# Patient Record
Sex: Female | Born: 1980 | Hispanic: No | Marital: Married | Smoking: Never smoker
Health system: Southern US, Community
[De-identification: ages and names within clinical notes are randomized; demographics above are authoritative.]

## PROBLEM LIST (undated history)

## (undated) ENCOUNTER — Inpatient Hospital Stay (HOSPITAL_COMMUNITY): Payer: Self-pay

## (undated) DIAGNOSIS — O24419 Gestational diabetes mellitus in pregnancy, unspecified control: Secondary | ICD-10-CM

## (undated) DIAGNOSIS — J45909 Unspecified asthma, uncomplicated: Secondary | ICD-10-CM

## (undated) DIAGNOSIS — J302 Other seasonal allergic rhinitis: Secondary | ICD-10-CM

## (undated) HISTORY — PX: NO PAST SURGERIES: SHX2092

---

## 2012-09-03 ENCOUNTER — Emergency Department (HOSPITAL_COMMUNITY)
Admission: EM | Admit: 2012-09-03 | Discharge: 2012-09-03 | Disposition: A | Discharge status: Home / Self Care | Payer: No Typology Code available for payment source | Source: Home / Self Care

## 2012-09-03 ENCOUNTER — Encounter (HOSPITAL_COMMUNITY): Payer: Self-pay

## 2012-09-03 DIAGNOSIS — J309 Allergic rhinitis, unspecified: Secondary | ICD-10-CM

## 2012-09-03 DIAGNOSIS — K089 Disorder of teeth and supporting structures, unspecified: Secondary | ICD-10-CM

## 2012-09-03 DIAGNOSIS — K0889 Other specified disorders of teeth and supporting structures: Secondary | ICD-10-CM

## 2012-09-03 MED ORDER — AMOXICILLIN 500 MG PO CAPS: 1000.0000 mg | ORAL_CAPSULE | Freq: Two times a day (BID) | ORAL | Status: DC

## 2012-09-03 MED ORDER — HYDROCODONE-ACETAMINOPHEN 5-325 MG PO TABS: 1.0000 | ORAL_TABLET | ORAL | Status: DC | PRN

## 2012-09-03 MED ORDER — MONTELUKAST SODIUM 10 MG PO TABS: 10.0000 mg | ORAL_TABLET | Freq: Every day | ORAL | Status: DC

## 2012-09-03 MED ORDER — KETOROLAC TROMETHAMINE 60 MG/2ML IM SOLN
60.0000 mg | Freq: Once | INTRAMUSCULAR | Status: AC
  Administered 2012-09-03: 60 mg via INTRAMUSCULAR

## 2012-09-03 NOTE — ED Notes (Signed)
C/o pain in right facial area; ? Bad tooth??; NAD

## 2012-09-03 NOTE — ED Provider Notes (Signed)
History     CSN: [id number]  Arrival date & time 09/03/12  1222   First MD Initiated Contact with Patient 09/03/12 1330      Chief Complaint  Patient presents with  . Facial Pain    (Consider location/radiation/quality/duration/timing/severity/associated sxs/prior treatment) HPI Comments: 32 year old female presents with a toothache in allergies. She speaks some [place], her husband speaks fluent [place]. His been complaining of right facial and toothache pain suggestive day. Pain is located adjacent to the right upper third molar.   She is also complaining of watery, sneezing and itchy nose for one year. Has taken some over-the-counter medications.   History reviewed. No pertinent past medical history.  History reviewed. No pertinent past surgical history.  History reviewed. No pertinent family history.  History  Substance Use Topics  . Smoking status: Not on file  . Smokeless tobacco: Not on file  . Alcohol Use: Not on file    OB History   Grav Para Term Preterm Abortions TAB SAB Ect Mult Living                  Review of Systems  Constitutional: Negative for fever, chills and activity change.  HENT: Positive for dental problem. Negative for ear pain, nosebleeds, congestion, sore throat and mouth sores.   Respiratory: Negative.   Cardiovascular: Negative.   Gastrointestinal: Negative.   Skin: Negative for color change, pallor and rash.  Neurological: Negative.   Psychiatric/Behavioral: Negative.     Allergies  Review of patient's allergies indicates no known allergies.  Home Medications   Current Outpatient Rx  Name  Route  Sig  Dispense  Refill  . amoxicillin (AMOXIL) 500 MG capsule   Oral   Take 2 capsules (1,000 mg total) by mouth 2 (two) times daily.   28 capsule   0   . HYDROcodone-acetaminophen (NORCO/VICODIN) 5-325 MG per tablet   Oral   Take 1 tablet by mouth every 4 (four) hours as needed for pain.   15 tablet   0   . montelukast  (SINGULAIR) 10 MG tablet   Oral   Take 1 tablet (10 mg total) by mouth at bedtime. For allergies   30 tablet   0     BP 106/71  Pulse 70  Temp(Src) 97.3 F (36.3 C) (Oral)  Resp 18  SpO2 100%  Physical Exam  Nursing note and vitals reviewed. Constitutional: She is oriented to person, place, and time. She appears well-developed and well-nourished.  HENT:  Right Ear: External ear normal.  Left Ear: External ear normal.  Mouth/Throat: Oropharynx is clear and moist. No oropharyngeal exudate.  Dental tenderness in the right upper third molar. There is puffiness in the mucosal aspect of the buccal cavity adjacent to the involved tooth. No purulence is seen. Is also mild gingival erythema. No external puffiness or swelling is appreciated.  Pulmonary/Chest: Effort normal.  Musculoskeletal: She exhibits no edema.  Neurological: She is alert and oriented to person, place, and time. She exhibits normal muscle tone.  Skin: Skin is warm and dry.  Psychiatric: She has a normal mood and affect.    ED Course  Procedures (including critical care time)  Labs Reviewed - No data to display No results found.   1. Odontalgia   2. Allergic rhinitis       MDM  Norco 5 q 4h prn pain Amoxil 500 2 bid for 7 d Ice to side of face singulair 10mg  q hs for allergies.  Call the dentist  on your [employer] for appoint.         Hayden Rasmussen, NP 09/03/12 [phone]

## 2012-09-03 NOTE — ED Provider Notes (Signed)
Medical screening examination/treatment/procedure(s) were performed by resident physician or non-physician practitioner and as supervising physician I was immediately available for consultation/collaboration.   Rosana Farnell DOUGLAS MD.   Tyrez Berrios D Jenney Brester, MD 09/03/12 1638 

## 2012-11-21 ENCOUNTER — Emergency Department (HOSPITAL_COMMUNITY)
Admission: EM | Admit: 2012-11-21 | Discharge: 2012-11-21 | Disposition: A | Discharge status: Home / Self Care | Payer: Medicaid Other | Attending: Emergency Medicine | Admitting: Emergency Medicine

## 2012-11-21 ENCOUNTER — Ambulatory Visit: Payer: MEDICAID | Attending: Family Medicine | Admitting: Family Medicine

## 2012-11-21 ENCOUNTER — Encounter (HOSPITAL_COMMUNITY): Payer: Self-pay | Admitting: Adult Health

## 2012-11-21 ENCOUNTER — Emergency Department (HOSPITAL_COMMUNITY): Payer: Medicaid Other

## 2012-11-21 VITALS — BP 89/64 | HR 119 | Temp 99.8°F | Resp 19 | Wt 183.0 lb

## 2012-11-21 DIAGNOSIS — R5383 Other fatigue: Secondary | ICD-10-CM | POA: Insufficient documentation

## 2012-11-21 DIAGNOSIS — R51 Headache: Secondary | ICD-10-CM | POA: Insufficient documentation

## 2012-11-21 DIAGNOSIS — R5381 Other malaise: Secondary | ICD-10-CM | POA: Insufficient documentation

## 2012-11-21 DIAGNOSIS — K089 Disorder of teeth and supporting structures, unspecified: Secondary | ICD-10-CM

## 2012-11-21 DIAGNOSIS — J029 Acute pharyngitis, unspecified: Secondary | ICD-10-CM | POA: Insufficient documentation

## 2012-11-21 DIAGNOSIS — Z3202 Encounter for pregnancy test, result negative: Secondary | ICD-10-CM | POA: Insufficient documentation

## 2012-11-21 DIAGNOSIS — R52 Pain, unspecified: Secondary | ICD-10-CM | POA: Insufficient documentation

## 2012-11-21 DIAGNOSIS — K0889 Other specified disorders of teeth and supporting structures: Secondary | ICD-10-CM

## 2012-11-21 DIAGNOSIS — J039 Acute tonsillitis, unspecified: Secondary | ICD-10-CM

## 2012-11-21 LAB — BASIC METABOLIC PANEL
BUN: 10 mg/dL (ref 6–23)
CO2: 23 mEq/L (ref 19–32)
Glucose, Bld: 165 mg/dL — ABNORMAL HIGH (ref 70–99)
Potassium: 3.6 mEq/L (ref 3.5–5.1)
Sodium: 135 mEq/L (ref 135–145)

## 2012-11-21 LAB — CBC WITH DIFFERENTIAL/PLATELET
Eosinophils Relative: 0 % (ref 0–5)
Hemoglobin: 14.2 g/dL (ref 12.0–15.0)
Lymphocytes Relative: 9 % — ABNORMAL LOW (ref 12–46)
Lymphs Abs: 1.2 10*3/uL (ref 0.7–4.0)
MCV: 80.4 fL (ref 78.0–100.0)
Monocytes Relative: 6 % (ref 3–12)
Neutrophils Relative %: 85 % — ABNORMAL HIGH (ref 43–77)
Platelets: 147 10*3/uL — ABNORMAL LOW (ref 150–400)
RBC: 5.01 MIL/uL (ref 3.87–5.11)
WBC: 13 10*3/uL — ABNORMAL HIGH (ref 4.0–10.5)

## 2012-11-21 LAB — POCT PREGNANCY, URINE: Preg Test, Ur: NEGATIVE

## 2012-11-21 LAB — URINALYSIS, ROUTINE W REFLEX MICROSCOPIC
Leukocytes, UA: NEGATIVE
Nitrite: NEGATIVE
Specific Gravity, Urine: 1.014 (ref 1.005–1.030)
pH: 8 (ref 5.0–8.0)

## 2012-11-21 LAB — URINE MICROSCOPIC-ADD ON

## 2012-11-21 LAB — RAPID STREP SCREEN (MED CTR MEBANE ONLY): Streptococcus, Group A Screen (Direct): POSITIVE — AB

## 2012-11-21 MED ORDER — PENICILLIN G BENZATHINE 1200000 UNIT/2ML IM SUSP
1.2000 10*6.[IU] | Freq: Once | INTRAMUSCULAR | Status: AC
  Administered 2012-11-21: 1.2 10*6.[IU] via INTRAMUSCULAR
  Filled 2012-11-21: qty 2

## 2012-11-21 MED ORDER — DEXAMETHASONE SODIUM PHOSPHATE 10 MG/ML IJ SOLN
10.0000 mg | Freq: Once | INTRAMUSCULAR | Status: AC
  Administered 2012-11-21: 10 mg via INTRAVENOUS
  Filled 2012-11-21: qty 1

## 2012-11-21 MED ORDER — KETOROLAC TROMETHAMINE 30 MG/ML IJ SOLN
30.0000 mg | Freq: Once | INTRAMUSCULAR | Status: AC
  Administered 2012-11-21: 30 mg via INTRAVENOUS
  Filled 2012-11-21: qty 1

## 2012-11-21 MED ORDER — AMOXICILLIN 875 MG PO TABS: 875.0000 mg | ORAL_TABLET | Freq: Two times a day (BID) | ORAL | Status: DC

## 2012-11-21 MED ORDER — AZITHROMYCIN 250 MG PO TABS
500.0000 mg | ORAL_TABLET | Freq: Once | ORAL | Status: AC
  Administered 2012-11-21: 500 mg via ORAL
  Filled 2012-11-21: qty 2

## 2012-11-21 MED ORDER — HYDROCODONE-ACETAMINOPHEN 7.5-500 MG/15ML PO SOLN: 15.0000 mL | Freq: Four times a day (QID) | ORAL | Status: DC | PRN

## 2012-11-21 MED ORDER — ACETAMINOPHEN 325 MG PO TABS
650.0000 mg | ORAL_TABLET | Freq: Four times a day (QID) | ORAL | Status: DC | PRN
  Administered 2012-11-21: 650 mg via ORAL
  Filled 2012-11-21: qty 2

## 2012-11-21 MED ORDER — SODIUM CHLORIDE 0.9 % IV BOLUS (SEPSIS)
1000.0000 mL | Freq: Once | INTRAVENOUS | Status: AC
  Administered 2012-11-21: 1000 mL via INTRAVENOUS

## 2012-11-21 NOTE — Patient Instructions (Signed)
Sore Throat A sore throat is pain, burning, irritation, or scratchiness of the throat. There is often pain or tenderness when swallowing or talking. A sore throat may be accompanied by other symptoms, such as coughing, sneezing, fever, and swollen neck glands. A sore throat is often the first sign of another sickness, such as a cold, flu, strep throat, or mononucleosis (commonly known as mono). Most sore throats go away without medical treatment. CAUSES  The most common causes of a sore throat include:  A viral infection, such as a cold, flu, or mono.  A bacterial infection, such as strep throat, tonsillitis, or whooping cough.  Seasonal allergies.  Dryness in the air.  Irritants, such as smoke or pollution.  Gastroesophageal reflux disease (GERD). HOME CARE INSTRUCTIONS   Only take over-the-counter medicines as directed by your caregiver.  Drink enough fluids to keep your urine clear or pale yellow.  Rest as needed.  Try using throat sprays, lozenges, or sucking on hard candy to ease any pain (if older than 4 years or as directed).  Sip warm liquids, such as broth, herbal tea, or warm water with honey to relieve pain temporarily. You may also eat or drink cold or frozen liquids such as frozen ice pops.  Gargle with salt water (mix 1 tsp salt with 8 oz of water).  Do not smoke and avoid secondhand smoke.  Put a cool-mist humidifier in your bedroom at night to moisten the air. You can also turn on a hot shower and sit in the bathroom with the door closed for 5 10 minutes. SEEK IMMEDIATE MEDICAL CARE IF:  You have difficulty breathing.  You are unable to swallow fluids, soft foods, or your saliva.  You have increased swelling in the throat.  Your sore throat does not get better in 7 days.  You have nausea and vomiting.  You have a fever or persistent symptoms for more than 2 3 days.  You have a fever and your symptoms suddenly get worse. MAKE SURE YOU:   Understand  these instructions.  Will watch your condition.  Will get help right away if you are not doing well or get worse. Document Released: 07/28/2004 Document Revised: 06/06/2012 Document Reviewed: 02/26/2012 Exit[employer] Patient Information 2014 [employer].  

## 2012-11-21 NOTE — ED Notes (Signed)
Radiology at bedside

## 2012-11-21 NOTE — Progress Notes (Signed)
Subjective:     Patient ID: Laurie Hess, female   DOB: 1981-04-04, 32 y.o.   MRN: [id number]  HPI Pt here with 24 hours of very sore throat, worsening, sharp and associated with subjective fever. She has tried nsaids but they have not helped. Hurts to swallow. No sick contacts.  Also she has dental pain in several areas of her mouth for the past week.   Says in the past she has been told she has high sugar and wants to know what to do about this.    Review of Systems no nausea, vomiting, polyuria or polydipsia     Objective:   Physical Exam  Nursing note and vitals reviewed. Constitutional: She appears well-developed and well-nourished.  Appears unwell  HENT:  Right Ear: External ear normal.  Left Ear: External ear normal.  Pharynx bright red, no exudates appreciated  Neck:  Mild cervical lymphadenopathy - tender  Cardiovascular: Normal rate, regular rhythm and normal heart sounds.   Pulmonary/Chest: Effort normal and breath sounds normal.  Skin: Skin is warm and dry.  Psychiatric: She has a normal mood and affect.       Assessment:     Acute pharyngitis - Plan: amoxicillin (AMOXIL) 875 MG tablet  Pain, dental       Plan:     Sore throat - treating empirically with abx, if not improving within 48 hours rtc. If acutely worse rtc before that time.   Dental pain and caries - call number on orange card for dentis - pointed out this number to her  ? High sugar - rtc early next week to address.

## 2012-11-21 NOTE — ED Notes (Addendum)
Presents with 2 days of fever, generalized body aches and right ear pain. Pt went to [facility] today and was prescribed amoxicillin, took one and came here.  Pt is tachycardic 116, febrile 101.8.  She is alert and oriented. hypotensive

## 2012-11-21 NOTE — Progress Notes (Signed)
Patient states has had a sore throat and fever since yesterday Today presents with fever and chills as well

## 2012-11-21 NOTE — ED Notes (Signed)
Dr. Glick at bedside.  

## 2012-11-21 NOTE — ED Notes (Signed)
Pt  And family know that urine is needed

## 2012-11-21 NOTE — Progress Notes (Signed)
Patient showed up in the office after discharge Complaining of pain all over bad headache Aches all over Per Dr Elisabeth Pigeon and Nurse patient was instructed To go to the ed for evaluation Her symptoms are not related to the antibiotics

## 2012-11-21 NOTE — ED Provider Notes (Signed)
History     CSN: [id number]  Arrival date & time 11/21/12  1711   First MD Initiated Contact with Patient 11/21/12 2003      Chief Complaint  Patient presents with  . Fever    (Consider location/radiation/quality/duration/timing/severity/associated sxs/prior treatment) HPI History provided by pt.  Her husband is translating.  Pt has had a severe sore throat since yesterday.  Today it has been associated w/ fever, L temporal headache, body aches and generalized weakness.  Has not had blurred vision, dizziness, cough, dyspnea, abd pain, N/V/D or GU sx. Was evaluated at an urgent care earlier today, diagnosed w/ acute pharyngitis and discharged home w/ amoxicillin.  Returned when her sx began to worsen and they referred her to ED.  No known sick contacts.  No PMH.   History reviewed. No pertinent past medical history.  History reviewed. No pertinent past surgical history.  History reviewed. No pertinent family history.  History  Substance Use Topics  . Smoking status: Never Smoker   . Smokeless tobacco: Not on file  . Alcohol Use: No    OB History   Grav Para Term Preterm Abortions TAB SAB Ect Mult Living                  Review of Systems  All other systems reviewed and are negative.    Allergies  Review of patient's allergies indicates no known allergies.  Home Medications   Current Outpatient Rx  Name  Route  Sig  Dispense  Refill  . amoxicillin (AMOXIL) 875 MG tablet   Oral   Take 1 tablet (875 mg total) by mouth 2 (two) times daily.   20 tablet   0     BP 94/54  Pulse 114  Temp(Src) 99.5 F (37.5 C) (Oral)  Resp 22  SpO2 97%  LMP 11/18/2012  Physical Exam  Nursing note and vitals reviewed. Constitutional: She is oriented to person, place, and time. She appears well-developed and well-nourished.  Uncomfortable appearing  HENT:  Head: Normocephalic and atraumatic.  Mild injection soft palate, tonsils and posterior pharynx.  Symmetric enlargement  of tonsils.  No exudate.  No trismus.  Uvula mid-line.  Eyes:  Normal appearance  Neck: Normal range of motion.  Cardiovascular: Regular rhythm and intact distal pulses.   Tachy at 110-115  Pulmonary/Chest: Effort normal and breath sounds normal. No respiratory distress.  Abdominal: Soft. Bowel sounds are normal. She exhibits no distension. There is no tenderness.  Musculoskeletal: Normal range of motion.  Lymphadenopathy:    She has cervical adenopathy.  Neurological: She is alert and oriented to person, place, and time.  Skin: Skin is warm and dry. No rash noted.  Psychiatric: She has a normal mood and affect. Her behavior is normal.    ED Course  Procedures (including critical care time)  Labs Reviewed  CBC WITH DIFFERENTIAL - Abnormal; Notable for the following:    WBC 13.0 (*)    Platelets 147 (*)    Neutrophils Relative % 85 (*)    Neutro Abs 11.0 (*)    Lymphocytes Relative 9 (*)    All other components within normal limits  BASIC METABOLIC PANEL - Abnormal; Notable for the following:    Glucose, Bld 165 (*)    All other components within normal limits  CG4 I-STAT (LACTIC ACID) - Abnormal; Notable for the following:    Lactic Acid, Venous 2.63 (*)    All other components within normal limits  POCT PREGNANCY, URINE  Dg Chest Port 1 View  (if Code Sepsis Called)  11/21/2012   *RADIOLOGY REPORT*  Clinical Data: Sore throat, fever, body aches  PORTABLE CHEST - 1 VIEW  Comparison: Portable exam 1817 hours without priors for comparison  Findings: Upper normal heart size. Mediastinal contours and pulmonary vascularity normal. Minimal right basilar atelectasis. No acute infiltrate, pleural effusion or pneumothorax. Bones unremarkable.  IMPRESSION: Minimal right basilar atelectasis.   Original Report Authenticated By: Ulyses Southward, M.D.     1. Tonsillitis       MDM  32yo healthy F presents w/ sore throat + fever, headache, body aches and generalized weakness.  On exam, A&O,  uncomfortable appearing, febrile, tachycardic, mildly hypotensive, symmetric tonsillar edema w/out exudate, cervical adenopathy, abd benign, no focal neuro deficits or meningeal signs.  Labs sig for leukocytosis and elevated lactate.  Strep screen and urinalysis pending.  CXR negative.  Fever resolved w/ tylenol in ED.  2nd liter NS bolus as well as toradol and bicillin (pt initially received 500mg  zithromax; my error, thought pt had an anaphylactic allergy to penicillin) ordered.  8:25 PM   U/A neg for infection.  Rapid strep positive.  All results have been discussed w/ pt and her family.  Her VS as well as lactate have normalized and she reports feeling much better.  D/c'd home w/ lortab elixir for pain.  Return precautions discussed. 11:08 PM         Otilio Miu, PA-C 11/21/12 2314  Arie Sabina Cristi Gwynn, PA-C 11/21/12 [phone]

## 2012-11-21 NOTE — ED Notes (Signed)
IV flushed with 10 ml normal saline.

## 2012-11-21 NOTE — ED Provider Notes (Signed)
32 year old female presents with a two-day history of progressive sore throat with difficulty swallowing. She arrived febrile and tachycardic. On exam, there is tonsillar hypertrophy and erythema with slight exudate present in voice is slightly muffled. There is anterior and posterior cervical adenopathy present. Lungs are clear. She does appear ill but does not appear overtly septic. She'll be given IV fluids and IV dexamethasone and treated for presumed strep infection.  Medical screening examination/treatment/procedure(s) were conducted as a shared visit with non-physician practitioner(s) and myself.  I personally evaluated the patient during the encounter   Dione Booze, MD 11/21/12 2045

## 2012-11-21 NOTE — ED Notes (Signed)
CG4 I-stat lactic acid = 0.97 mmol/L Didn't cross over from mini lab.

## 2012-11-21 NOTE — ED Notes (Signed)
Pt c/o generalized body ache and fever.

## 2012-11-22 ENCOUNTER — Ambulatory Visit: Payer: No Typology Code available for payment source

## 2012-11-23 ENCOUNTER — Ambulatory Visit: Payer: No Typology Code available for payment source

## 2012-11-28 LAB — CULTURE, BLOOD (ROUTINE X 2): Culture: NO GROWTH

## 2013-01-15 ENCOUNTER — Encounter (HOSPITAL_COMMUNITY): Payer: Self-pay | Admitting: *Deleted

## 2013-01-15 ENCOUNTER — Other Ambulatory Visit: Payer: No Typology Code available for payment source

## 2013-01-15 ENCOUNTER — Emergency Department (HOSPITAL_COMMUNITY)
Admission: EM | Admit: 2013-01-15 | Discharge: 2013-01-15 | Disposition: A | Discharge status: Home / Self Care | Payer: Medicaid Other | Attending: Emergency Medicine | Admitting: Emergency Medicine

## 2013-01-15 DIAGNOSIS — Z3490 Encounter for supervision of normal pregnancy, unspecified, unspecified trimester: Secondary | ICD-10-CM

## 2013-01-15 DIAGNOSIS — S8990XA Unspecified injury of unspecified lower leg, initial encounter: Secondary | ICD-10-CM | POA: Insufficient documentation

## 2013-01-15 DIAGNOSIS — Y9389 Activity, other specified: Secondary | ICD-10-CM | POA: Insufficient documentation

## 2013-01-15 DIAGNOSIS — S8992XA Unspecified injury of left lower leg, initial encounter: Secondary | ICD-10-CM

## 2013-01-15 DIAGNOSIS — S99929A Unspecified injury of unspecified foot, initial encounter: Secondary | ICD-10-CM | POA: Insufficient documentation

## 2013-01-15 DIAGNOSIS — O99891 Other specified diseases and conditions complicating pregnancy: Secondary | ICD-10-CM | POA: Insufficient documentation

## 2013-01-15 DIAGNOSIS — Y92410 Unspecified street and highway as the place of occurrence of the external cause: Secondary | ICD-10-CM | POA: Insufficient documentation

## 2013-01-15 MED ORDER — ACETAMINOPHEN 325 MG PO TABS
650.0000 mg | ORAL_TABLET | Freq: Once | ORAL | Status: AC
  Administered 2013-01-15: 650 mg via ORAL
  Filled 2013-01-15: qty 2
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
## 2013-01-15 NOTE — ED Notes (Signed)
Ice pack to left knee
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
## 2013-01-15 NOTE — ED Notes (Signed)
Patient xray cancelled by pa

## 2013-01-16 NOTE — ED Provider Notes (Signed)
Medical screening examination/treatment/procedure(s) were performed by non-physician practitioner and as supervising physician I was immediately available for consultation/collaboration.  Detta Mellin T Quanda Pavlicek, MD 01/16/13 1532 

## 2013-01-18 ENCOUNTER — Encounter: Payer: Self-pay | Admitting: Obstetrics & Gynecology

## 2013-01-18 ENCOUNTER — Ambulatory Visit (INDEPENDENT_AMBULATORY_CARE_PROVIDER_SITE_OTHER): Payer: No Typology Code available for payment source | Admitting: General Practice

## 2013-01-18 DIAGNOSIS — Z3201 Encounter for pregnancy test, result positive: Secondary | ICD-10-CM

## 2013-01-18 DIAGNOSIS — Z3200 Encounter for pregnancy test, result unknown: Secondary | ICD-10-CM

## 2013-01-18 LAB — POCT PREGNANCY, URINE: Preg Test, Ur: POSITIVE — AB

## 2013-01-29 ENCOUNTER — Emergency Department (HOSPITAL_COMMUNITY): Payer: Medicaid Other

## 2013-01-29 ENCOUNTER — Emergency Department (HOSPITAL_COMMUNITY)
Admission: EM | Admit: 2013-01-29 | Discharge: 2013-01-29 | Disposition: A | Discharge status: Home / Self Care | Payer: Medicaid Other | Attending: Emergency Medicine | Admitting: Emergency Medicine

## 2013-01-29 ENCOUNTER — Encounter (HOSPITAL_COMMUNITY): Payer: Self-pay | Admitting: Emergency Medicine

## 2013-01-29 DIAGNOSIS — R110 Nausea: Secondary | ICD-10-CM | POA: Insufficient documentation

## 2013-01-29 DIAGNOSIS — O2390 Unspecified genitourinary tract infection in pregnancy, unspecified trimester: Secondary | ICD-10-CM | POA: Insufficient documentation

## 2013-01-29 DIAGNOSIS — O219 Vomiting of pregnancy, unspecified: Secondary | ICD-10-CM | POA: Insufficient documentation

## 2013-01-29 DIAGNOSIS — N390 Urinary tract infection, site not specified: Secondary | ICD-10-CM

## 2013-01-29 DIAGNOSIS — Z3490 Encounter for supervision of normal pregnancy, unspecified, unspecified trimester: Secondary | ICD-10-CM

## 2013-01-29 DIAGNOSIS — R109 Unspecified abdominal pain: Secondary | ICD-10-CM | POA: Insufficient documentation

## 2013-01-29 DIAGNOSIS — K219 Gastro-esophageal reflux disease without esophagitis: Secondary | ICD-10-CM

## 2013-01-29 DIAGNOSIS — O9989 Other specified diseases and conditions complicating pregnancy, childbirth and the puerperium: Secondary | ICD-10-CM | POA: Insufficient documentation

## 2013-01-29 LAB — CBC WITH DIFFERENTIAL/PLATELET
Eosinophils Relative: 3 % (ref 0–5)
HCT: 36.8 % (ref 36.0–46.0)
Hemoglobin: 12.6 g/dL (ref 12.0–15.0)
Lymphocytes Relative: 31 % (ref 12–46)
Lymphs Abs: 2.5 10*3/uL (ref 0.7–4.0)
MCV: 82.3 fL (ref 78.0–100.0)
Monocytes Relative: 6 % (ref 3–12)
Neutro Abs: 4.9 10*3/uL (ref 1.7–7.7)
RBC: 4.47 MIL/uL (ref 3.87–5.11)
WBC: 8.1 10*3/uL (ref 4.0–10.5)

## 2013-01-29 LAB — COMPREHENSIVE METABOLIC PANEL
AST: 17 U/L (ref 0–37)
Alkaline Phosphatase: 47 U/L (ref 39–117)
BUN: 10 mg/dL (ref 6–23)
CO2: 24 mEq/L (ref 19–32)
Chloride: 100 mEq/L (ref 96–112)
Creatinine, Ser: 0.53 mg/dL (ref 0.50–1.10)
GFR calc non Af Amer: >90 mL/min (ref >90)
Potassium: 3.9 mEq/L (ref 3.5–5.1)
Total Bilirubin: 0.2 mg/dL — ABNORMAL LOW (ref 0.3–1.2)

## 2013-01-29 LAB — HCG, QUANTITATIVE, PREGNANCY: hCG, Beta Chain, Quant, S: 59311 m[IU]/mL — ABNORMAL HIGH (ref <5)

## 2013-01-29 LAB — URINALYSIS, ROUTINE W REFLEX MICROSCOPIC
Bilirubin Urine: NEGATIVE
Glucose, UA: NEGATIVE mg/dL
Ketones, ur: 15 mg/dL — AB
Protein, ur: NEGATIVE mg/dL
pH: 6.5 (ref 5.0–8.0)

## 2013-01-29 LAB — URINE MICROSCOPIC-ADD ON

## 2013-01-29 MED ORDER — ONDANSETRON 4 MG PO TBDP
8.0000 mg | ORAL_TABLET | Freq: Once | ORAL | Status: AC
  Administered 2013-01-29: 8 mg via ORAL
  Filled 2013-01-29: qty 2

## 2013-01-29 MED ORDER — PANTOPRAZOLE SODIUM 40 MG PO TBEC
40.0000 mg | DELAYED_RELEASE_TABLET | Freq: Every day | ORAL | Status: DC
  Administered 2013-01-29: 40 mg via ORAL
  Filled 2013-01-29: qty 1

## 2013-01-29 MED ORDER — ONDANSETRON HCL 8 MG PO TABS: 4.0000 mg | ORAL_TABLET | ORAL | Status: DC | PRN

## 2013-01-29 MED ORDER — OMEPRAZOLE 20 MG PO CPDR: 20.0000 mg | DELAYED_RELEASE_CAPSULE | Freq: Every day | ORAL | Status: DC

## 2013-01-29 MED ORDER — NITROFURANTOIN MONOHYD MACRO 100 MG PO CAPS: 100.0000 mg | ORAL_CAPSULE | Freq: Two times a day (BID) | ORAL | Status: DC

## 2013-01-29 NOTE — ED Provider Notes (Signed)
CSN: [id number]     Arrival date & time 01/29/13  1625 History     First MD Initiated Contact with Patient 01/29/13 1736     Chief Complaint  Patient presents with  . Abdominal Pain   (Consider location/radiation/quality/duration/timing/severity/associated sxs/prior Treatment) The history is provided by the patient and medical records. No language interpreter was used.    Laurie Hess is a 32 y.o. female  with a hx of presents to the Emergency Department complaining of gradual, intermittent, progressively worsening RUQ abd pain for several weeks happening in the afternoons, always after lunch for which she normally eats rice and other things.  Pain is vague and indescribable in nature; but she does admit to a burning sensation. She states is begins in the RUQ and radiates through the epigastrum.  Patient states she is [redacted] weeks pregnant but has not had any OB testing. She denies vaginal bleeding, vaginal discharge or lower abdominal pain.  G3 P2 Associated symptoms include feeling of burning in her epigastrium, occasional nausea and vomiting.  Nothing has been tried, nothing makes it better and nothing makes it worse.  Pt denies fever, chills, headache, neck pain, chest pain, shortness of breath, diarrhea, weakness, dizziness, syncope, dysuria, hematuria.     History reviewed. No pertinent past medical history. History reviewed. No pertinent past surgical history. History reviewed. No pertinent family history. History  Substance Use Topics  . Smoking status: Never Smoker   . Smokeless tobacco: Not on file  . Alcohol Use: No   OB History   Grav Para Term Preterm Abortions TAB SAB Ect Mult Living   1              Review of Systems  Constitutional: Negative for fever, diaphoresis, appetite change, fatigue and unexpected weight change.  HENT: Negative for mouth sores, trouble swallowing, neck pain and neck stiffness.   Respiratory: Negative for cough, chest tightness, shortness of  breath, wheezing and stridor.   Cardiovascular: Negative for chest pain and palpitations.  Gastrointestinal: Positive for nausea, vomiting and abdominal pain. Negative for diarrhea, constipation, blood in stool, abdominal distention and rectal pain.  Genitourinary: Negative for dysuria, urgency, frequency, hematuria, flank pain and difficulty urinating.  Musculoskeletal: Negative for back pain.  Skin: Negative for rash.  Neurological: Negative for weakness.  Hematological: Negative for adenopathy.  Psychiatric/Behavioral: Negative for confusion.  All other systems reviewed and are negative.    Allergies  Dust mite extract  Home Medications   Current Outpatient Rx  Name  Route  Sig  Dispense  Refill  . OVER THE COUNTER MEDICATION   Oral   Take 1 tablet by mouth daily. "[employer] Allergy 25 mg tablet"         . nitrofurantoin, macrocrystal-monohydrate, (MACROBID) 100 MG capsule   Oral   Take 1 capsule (100 mg total) by mouth 2 (two) times daily.   10 capsule   0   . omeprazole (PRILOSEC) 20 MG capsule   Oral   Take 1 capsule (20 mg total) by mouth daily.   30 capsule   0   . ondansetron (ZOFRAN) 8 MG tablet   Oral   Take 0.5 tablets (4 mg total) by mouth every 4 (four) hours as needed for nausea.   20 tablet   0    BP 115/66  Pulse 72  Temp(Src) 97.9 F (36.6 C) (Oral)  Resp 16  SpO2 98%  LMP 11/18/2012 Physical Exam  Nursing note and vitals reviewed. Constitutional: She is oriented  to person, place, and time. She appears well-developed and well-nourished.  HENT:  Head: Normocephalic and atraumatic.  Mouth/Throat: Oropharynx is clear and moist.  Eyes: Conjunctivae are normal. Pupils are equal, round, and reactive to light. No scleral icterus.  Neck: Normal range of motion.  Cardiovascular: Normal rate, regular rhythm, normal heart sounds and intact distal pulses.   No murmur heard. Pulmonary/Chest: Effort normal and breath sounds normal. No accessory muscle  usage. Not tachypneic. No respiratory distress. She has no decreased breath sounds. She has no wheezes. She has no rhonchi. She has no rales. She exhibits no tenderness and no bony tenderness.  Abdominal: Soft. Normal appearance and bowel sounds are normal. She exhibits no distension and no mass. There is tenderness in the right upper quadrant and epigastric area. There is no rigidity, no rebound, no guarding and no CVA tenderness.  Right upper quadrant and epigastric very mild tenderness to palpation; no guarding or rigidity No CVA tenderness  Genitourinary:  No fetal heart tones audible on Doppler  Musculoskeletal: Normal range of motion. She exhibits no edema and no tenderness.  Lymphadenopathy:    She has no cervical adenopathy.  Neurological: She is alert and oriented to person, place, and time. She exhibits normal muscle tone. Coordination normal.  Skin: Skin is warm and dry. No rash noted. No erythema.  Psychiatric: She has a normal mood and affect.    ED Course   Procedures (including critical care time)  Labs Reviewed  COMPREHENSIVE METABOLIC PANEL - Abnormal; Notable for the following:    Albumin 3.3 (*)    Total Bilirubin 0.2 (*)    All other components within normal limits  URINALYSIS, ROUTINE W REFLEX MICROSCOPIC - Abnormal; Notable for the following:    APPearance CLOUDY (*)    Ketones, ur 15 (*)    Leukocytes, UA MODERATE (*)    All other components within normal limits  URINE MICROSCOPIC-ADD ON - Abnormal; Notable for the following:    Squamous Epithelial / LPF MANY (*)    Bacteria, UA FEW (*)    All other components within normal limits  HCG, QUANTITATIVE, PREGNANCY - Abnormal; Notable for the following:    hCG, Beta Chain, Quant, [place] [address] (*)    All other components within normal limits  URINE CULTURE  CBC WITH DIFFERENTIAL  LIPASE, BLOOD  ABO/RH   US Abdomen Complete  01/29/2013   *RADIOLOGY REPORT*  Clinical Data:  Right upper quadrant pain  COMPLETE  ABDOMINAL ULTRASOUND  Comparison:  None.  Findings:  Gallbladder:  No gallstones, gallbladder wall thickening, or pericholecystic fluid.  Common bile duct:  3.2 mm.  Liver:  Slightly increased echogenicity consistent with fatty infiltration.  No biliary ductal dilatation is noted.  IVC:  Appears normal.  Pancreas:  Limited visualization due to overlying bowel gas.  No gross abnormality is seen.  Spleen:  11.1 cm.  No mass lesion is seen.  Right Kidney:  10.5 cm in length.  No mass lesion or hydronephrosis is noted.  Left Kidney:  12.1 cm in greatest length.  No mass lesion or hydronephrosis is noted.  Abdominal aorta:  No aneurysm identified.  IMPRESSION: Slight fatty infiltration.  No other focal abnormality is noted.   Original Report Authenticated By: Alcide Clever, M.D.   US Ob Comp Less 14 Wks  01/29/2013   *RADIOLOGY REPORT*  Clinical Data: Pregnant, abdominal pain  OBSTETRIC <14 WK [place] AND TRANSVAGINAL OB US  Technique:  Both transabdominal and transvaginal ultrasound examinations were performed  for complete evaluation of the gestation as well as the maternal uterus, adnexal regions, and pelvic cul-de-sac.  Transvaginal technique was performed to assess early pregnancy.  Comparison:  None.  Intrauterine gestational sac:  Visualized/normal in shape. Yolk sac: Visualized Embryo: Visualized Cardiac Activity: Visualized Heart Rate: 187 bpm  CRL: 26  mm  9 w  3 d         [place] EDC: 08/31/13  Maternal uterus/adnexae: The ovaries are normal.  IMPRESSION: Single live intrauterine gestation measuring 9 weeks 3 days by today's exam.  No acute abnormality.   Original Report Authenticated By: Christiana Pellant, M.D.   US Ob Transvaginal  01/29/2013   *RADIOLOGY REPORT*  Clinical Data: Pregnant, abdominal pain  OBSTETRIC <14 WK [place] AND TRANSVAGINAL OB US  Technique:  Both transabdominal and transvaginal ultrasound examinations were performed for complete evaluation of the gestation as well as the maternal uterus, adnexal  regions, and pelvic cul-de-sac.  Transvaginal technique was performed to assess early pregnancy.  Comparison:  None.  Intrauterine gestational sac:  Visualized/normal in shape. Yolk sac: Visualized Embryo: Visualized Cardiac Activity: Visualized Heart Rate: 187 bpm  CRL: 26  mm  9 w  3 d         [place] EDC: 08/31/13  Maternal uterus/adnexae: The ovaries are normal.  IMPRESSION: Single live intrauterine gestation measuring 9 weeks 3 days by today's exam.  No acute abnormality.   Original Report Authenticated By: Christiana Pellant, M.D.   1. Acid reflux   2. Nausea   3. Pregnancy   4. UTI (lower urinary tract infection)     MDM  Laurie Hess is a right upper quadrant abdominal pain and burning sensation in the epigastrium for approximately one month. Patient states she is [redacted] weeks pregnant, but hasn't had any prenatal care..  She has a scheduled prenatal visit on 02/19/2013.  Will obtain labs and ultrasound.  Patient with Rh+, beta hCG consistent with 10 weeks of pregnancy, CBC unremarkable, CMP and lipase unremarkable. OB ultrasound with single intrauterine pregnancy at 9 weeks 3 days and gallbladder without evidence of cholelithiasis or cholecystitis.  Fetal heart rate 187.  Patient here with question urinary tract infection with 3-6 white blood cells and moderate leukocytes without symptoms of dysuria. We'll treat with Macrobid.  Upper quadrant and epigastric pain likely secondary to acid reflux. We'll begin ondansetron and omeprazole.  I have also discussed reasons to return immediately to the ER.  Patient expresses understanding and agrees with plan.    Laurie Client Javyon Fontan, PA-C 01/29/13 2156

## 2013-01-29 NOTE — ED Notes (Signed)
EKG completed in triage.

## 2013-01-29 NOTE — ED Provider Notes (Signed)
Medical screening examination/treatment/procedure(s) were performed by non-physician practitioner and as supervising physician I was immediately available for consultation/collaboration.   Gilda Crease, MD 01/29/13 2202

## 2013-01-29 NOTE — ED Notes (Signed)
Pt c/o upper abd pain x 1 month; pt sts LMP was 11/18/12 and is [redacted] weeks pregnant; pt G3 P2

## 2013-01-30 LAB — URINE CULTURE: Colony Count: 90000

## 2013-02-04 ENCOUNTER — Encounter: Payer: Self-pay | Admitting: Obstetrics & Gynecology

## 2013-02-18 DIAGNOSIS — O99810 Abnormal glucose complicating pregnancy: Secondary | ICD-10-CM

## 2013-02-18 DIAGNOSIS — E669 Obesity, unspecified: Secondary | ICD-10-CM

## 2013-02-18 DIAGNOSIS — O99210 Obesity complicating pregnancy, unspecified trimester: Secondary | ICD-10-CM

## 2013-02-19 ENCOUNTER — Other Ambulatory Visit (HOSPITAL_COMMUNITY)
Admission: RE | Admit: 2013-02-19 | Discharge: 2013-02-19 | Disposition: A | Discharge status: Home / Self Care | Payer: Medicaid Other | Source: Ambulatory Visit | Attending: Obstetrics & Gynecology | Admitting: Obstetrics & Gynecology

## 2013-02-19 ENCOUNTER — Encounter: Payer: Self-pay | Admitting: Obstetrics & Gynecology

## 2013-02-19 ENCOUNTER — Ambulatory Visit (INDEPENDENT_AMBULATORY_CARE_PROVIDER_SITE_OTHER): Payer: Medicaid Other | Admitting: Obstetrics & Gynecology

## 2013-02-19 VITALS — BP 109/67 | Temp 96.7°F | Ht 62.0 in | Wt 173.5 lb

## 2013-02-19 DIAGNOSIS — Z3480 Encounter for supervision of other normal pregnancy, unspecified trimester: Secondary | ICD-10-CM

## 2013-02-19 DIAGNOSIS — E669 Obesity, unspecified: Secondary | ICD-10-CM

## 2013-02-19 DIAGNOSIS — Z1151 Encounter for screening for human papillomavirus (HPV): Secondary | ICD-10-CM | POA: Insufficient documentation

## 2013-02-19 DIAGNOSIS — Z01419 Encounter for gynecological examination (general) (routine) without abnormal findings: Secondary | ICD-10-CM | POA: Insufficient documentation

## 2013-02-19 DIAGNOSIS — O99210 Obesity complicating pregnancy, unspecified trimester: Secondary | ICD-10-CM | POA: Insufficient documentation

## 2013-02-19 DIAGNOSIS — Z113 Encounter for screening for infections with a predominantly sexual mode of transmission: Secondary | ICD-10-CM | POA: Insufficient documentation

## 2013-02-19 LAB — POCT URINALYSIS DIP (DEVICE)
Bilirubin Urine: NEGATIVE
Glucose, UA: NEGATIVE mg/dL
Hgb urine dipstick: NEGATIVE
Nitrite: NEGATIVE
Specific Gravity, Urine: >=1.03 (ref 1.005–1.030)
pH: 6 (ref 5.0–8.0)

## 2013-02-19 NOTE — Progress Notes (Signed)
Pulse- 84 Pt reports pulling sensation under right breast New ob packet given

## 2013-02-19 NOTE — Progress Notes (Signed)
   Subjective:    Laurie Hess is a [id number] [redacted]w[redacted]d being seen today for her first obstetrical visit.  Her obstetrical history is significant for obesity. Patient does intend to breast feed. Pregnancy history fully reviewed.  Patient reports no complaints.  Filed Vitals:   02/19/13 0823 02/19/13 0825  BP: 109/67   Temp: 96.7 F (35.9 C)   Height:  5\' 2"  (1.575 m)    HISTORY: OB History  Gravida Para Term Preterm AB SAB TAB Ectopic Multiple Living  3 2 2       2     # Outcome Date GA Lbr Len/2nd Weight Sex Delivery Anes PTL Lv  3 CUR           2 TRM 04/29/08 [redacted]w[redacted]d  8 lb 13.1 oz (4 kg) M SVD None  Y     Comments: postdates  1 TRM 04/24/06 [redacted]w[redacted]d  7 lb 11.5 oz (3.5 kg) M SVD None  Y     Comments: postdates     History reviewed. No pertinent past medical history. History reviewed. No pertinent past surgical history. History reviewed. No pertinent family history.   Exam    Uterus:     Pelvic Exam:    Perineum: No Hemorrhoids   Vulva: normal   Vagina:  normal mucosa   pH:    Cervix: anteverted   Adnexa: normal adnexa   Bony Pelvis: android  System: Breast:  normal appearance, no masses or tenderness   Skin: normal coloration and turgor, no rashes    Neurologic: oriented   Extremities: normal strength, tone, and muscle mass   HEENT PERRLA   Mouth/Teeth mucous membranes moist, pharynx normal without lesions   Neck supple   Cardiovascular: regular rate and rhythm   Respiratory:  appears well, vitals normal, no respiratory distress, acyanotic, normal RR, ear and throat exam is normal, neck free of mass or lymphadenopathy, chest clear, no wheezing, crepitations, rhonchi, normal symmetric air entry   Abdomen: soft, non-tender; bowel sounds normal; no masses,  no organomegaly   Urinary: urethral meatus normal      Assessment:    Pregnancy: G3P2002 There are no active problems to display for this patient.       Plan:     Initial labs drawn. Prenatal  vitamins. Problem list reviewed and updated. Genetic Screening discussed Quad Screen: undecided.  Ultrasound discussed; fetal survey: requested.  Follow up in 4 weeks.  Malissa Slay C. Marice Potter, MD 02/19/2013

## 2013-02-20 LAB — OBSTETRIC PANEL
Antibody Screen: NEGATIVE
Basophils Relative: 0 % (ref 0–1)
Eosinophils Absolute: 0.4 10*3/uL (ref 0.0–0.7)
HCT: 37.9 % (ref 36.0–46.0)
Hemoglobin: 12.5 g/dL (ref 12.0–15.0)
Hepatitis B Surface Ag: NEGATIVE
Lymphs Abs: 2.1 10*3/uL (ref 0.7–4.0)
MCH: 27.4 pg (ref 26.0–34.0)
MCHC: 33 g/dL (ref 30.0–36.0)
MCV: 82.9 fL (ref 78.0–100.0)
Monocytes Absolute: 0.4 10*3/uL (ref 0.1–1.0)
Monocytes Relative: 5 % (ref 3–12)
Neutrophils Relative %: 62 % (ref 43–77)
RBC: 4.57 MIL/uL (ref 3.87–5.11)
Rh Type: POSITIVE

## 2013-02-21 LAB — HEMOGLOBINOPATHY EVALUATION
Hemoglobin Other: 0 %
Hgb A2 Quant: 2.9 % (ref 2.2–3.2)
Hgb A: 97.1 % (ref 96.8–97.8)
Hgb S Quant: 0 %

## 2013-02-21 LAB — CULTURE, OB URINE: Colony Count: 50000

## 2013-03-01 ENCOUNTER — Encounter: Payer: Self-pay | Admitting: *Deleted

## 2013-03-20 ENCOUNTER — Encounter: Payer: Self-pay | Admitting: Advanced Practice Midwife

## 2013-03-20 ENCOUNTER — Ambulatory Visit (INDEPENDENT_AMBULATORY_CARE_PROVIDER_SITE_OTHER): Payer: Medicaid Other | Admitting: Advanced Practice Midwife

## 2013-03-20 ENCOUNTER — Encounter: Payer: Self-pay | Admitting: *Deleted

## 2013-03-20 VITALS — BP 110/69 | Temp 97.3°F | Wt 175.5 lb

## 2013-03-20 DIAGNOSIS — E669 Obesity, unspecified: Secondary | ICD-10-CM

## 2013-03-20 LAB — POCT URINALYSIS DIP (DEVICE)
Glucose, UA: 100 mg/dL — AB
Protein, ur: NEGATIVE mg/dL
Urobilinogen, UA: 0.2 mg/dL (ref 0.0–1.0)

## 2013-03-20 NOTE — Patient Instructions (Addendum)
Pregnancy - Second Trimester The second trimester of pregnancy (3 to 6 months) is a period of rapid growth for you and your baby. At the end of the sixth month, your baby is about 9 inches long and weighs 1 1/2 pounds. You will begin to feel the baby move between 18 and 20 weeks of the pregnancy. This is called quickening. Weight gain is faster. A clear fluid (colostrum) may leak out of your breasts. You may feel small contractions of the womb (uterus). This is known as false labor or Braxton-Hicks contractions. This is like a practice for labor when the baby is ready to be born. Usually, the problems with morning sickness have usually passed by the end of your first trimester. Some women develop small dark blotches (called cholasma, mask of pregnancy) on their face that usually goes away after the baby is born. Exposure to the sun makes the blotches worse. Acne may also develop in some pregnant women and pregnant women who have acne, may find that it goes away. PRENATAL EXAMS  Blood work may continue to be done during prenatal exams. These tests are done to check on your health and the probable health of your baby. Blood work is used to follow your blood levels (hemoglobin). Anemia (low hemoglobin) is common during pregnancy. Iron and vitamins are given to help prevent this. You will also be checked for diabetes between 24 and 28 weeks of the pregnancy. Some of the previous blood tests may be repeated.  The size of the uterus is measured during each visit. This is to make sure that the baby is continuing to grow properly according to the dates of the pregnancy.  Your blood pressure is checked every prenatal visit. This is to make sure you are not getting toxemia.  Your urine is checked to make sure you do not have an infection, diabetes or protein in the urine.  Your weight is checked often to make sure gains are happening at the suggested rate. This is to ensure that both you and your baby are growing  normally.  Sometimes, an ultrasound is performed to confirm the proper growth and development of the baby. This is a test which bounces harmless sound waves off the baby so your caregiver can more accurately determine due dates. Sometimes, a test is done on the amniotic fluid surrounding the baby. This test is called an amniocentesis. The amniotic fluid is obtained by sticking a needle into the belly (abdomen). This is done to check the chromosomes in instances where there is a concern about possible genetic problems with the baby. It is also sometimes done near the end of pregnancy if an early delivery is required. In this case, it is done to help make sure the baby's lungs are mature enough for the baby to live outside of the womb. CHANGES OCCURING IN THE SECOND TRIMESTER OF PREGNANCY Your body goes through many changes during pregnancy. They vary from person to person. Talk to your caregiver about changes you notice that you are concerned about.  During the second trimester, you will likely have an increase in your appetite. It is normal to have cravings for certain foods. This varies from person to person and pregnancy to pregnancy.  Your lower abdomen will begin to bulge.  You may have to urinate more often because the uterus and baby are pressing on your bladder. It is also common to get more bladder infections during pregnancy. You can help this by drinking lots of fluids  and emptying your bladder before and after intercourse.  You may begin to get stretch marks on your hips, abdomen, and breasts. These are normal changes in the body during pregnancy. There are no exercises or medicines to take that prevent this change.  You may begin to develop swollen and bulging veins (varicose veins) in your legs. Wearing support hose, elevating your feet for 15 minutes, 3 to 4 times a day and limiting salt in your diet helps lessen the problem.  Heartburn may develop as the uterus grows and pushes up  against the stomach. Antacids recommended by your caregiver helps with this problem. Also, eating smaller meals 4 to 5 times a day helps.  Constipation can be treated with a stool softener or adding bulk to your diet. Drinking lots of fluids, and eating vegetables, fruits, and whole grains are helpful.  Exercising is also helpful. If you have been very active up until your pregnancy, most of these activities can be continued during your pregnancy. If you have been less active, it is helpful to start an exercise program such as walking.  Hemorrhoids may develop at the end of the second trimester. Warm sitz baths and hemorrhoid cream recommended by your caregiver helps hemorrhoid problems.  Backaches may develop during this time of your pregnancy. Avoid heavy lifting, wear low heal shoes, and practice good posture to help with backache problems.  Some pregnant women develop tingling and numbness of their hand and fingers because of swelling and tightening of ligaments in the wrist (carpel tunnel syndrome). This goes away after the baby is born.  As your breasts enlarge, you may have to get a bigger bra. Get a comfortable, cotton, support bra. Do not get a nursing bra until the last month of the pregnancy if you will be nursing the baby.  You may get a dark line from your belly button to the pubic area called the linea nigra.  You may develop rosy cheeks because of increase blood flow to the face.  You may develop spider looking lines of the face, neck, arms, and chest. These go away after the baby is born. HOME CARE INSTRUCTIONS   It is extremely important to avoid all smoking, herbs, alcohol, and unprescribed drugs during your pregnancy. These chemicals affect the formation and growth of the baby. Avoid these chemicals throughout the pregnancy to ensure the delivery of a healthy infant.  Most of your home care instructions are the same as suggested for the first trimester of your pregnancy.  Keep your caregiver's appointments. Follow your caregiver's instructions regarding medicine use, exercise, and diet.  During pregnancy, you are providing food for you and your baby. Continue to eat regular, well-balanced meals. Choose foods such as meat, fish, milk and other low fat dairy products, vegetables, fruits, and whole-grain breads and cereals. Your caregiver will tell you of the ideal weight gain.  A physical sexual relationship may be continued up until near the end of pregnancy if there are no other problems. Problems could include early (premature) leaking of amniotic fluid from the membranes, vaginal bleeding, abdominal pain, or other medical or pregnancy problems.  Exercise regularly if there are no restrictions. Check with your caregiver if you are unsure of the safety of some of your exercises. The greatest weight gain will occur in the last 2 trimesters of pregnancy. Exercise will help you:  Control your weight.  Get you in shape for labor and delivery.  Lose weight after you have the baby.  Wear  a good support or jogging bra for breast tenderness during pregnancy. This may help if worn during sleep. Pads or tissues may be used in the bra if you are leaking colostrum.  Do not use hot tubs, steam rooms or saunas throughout the pregnancy.  Wear your seat belt at all times when driving. This protects you and your baby if you are in an accident.  Avoid raw meat, uncooked cheese, cat litter boxes, and soil used by cats. These carry germs that can cause birth defects in the baby.  The second trimester is also a good time to visit your dentist for your dental health if this has not been done yet. Getting your teeth cleaned is okay. Use a soft toothbrush. Brush gently during pregnancy.  It is easier to leak urine during pregnancy. Tightening up and strengthening the pelvic muscles will help with this problem. Practice stopping your urination while you are going to the bathroom.  These are the same muscles you need to strengthen. It is also the muscles you would use as if you were trying to stop from passing gas. You can practice tightening these muscles up 10 times a set and repeating this about 3 times per day. Once you know what muscles to tighten up, do not perform these exercises during urination. It is more likely to contribute to an infection by backing up the urine.  Ask for help if you have financial, counseling, or nutritional needs during pregnancy. Your caregiver will be able to offer counseling for these needs as well as refer you for other special needs.  Your skin may become oily. If so, wash your face with mild soap, use non-greasy moisturizer and oil or cream based makeup. MEDICINES AND DRUG USE IN PREGNANCY  Take prenatal vitamins as directed. The vitamin should contain 1 milligram of folic acid. Keep all vitamins out of reach of children. Only a couple vitamins or tablets containing iron may be fatal to a baby or young child when ingested.  Avoid use of all medicines, including herbs, over-the-counter medicines, not prescribed or suggested by your caregiver. Only take over-the-counter or prescription medicines for pain, discomfort, or fever as directed by your caregiver. Do not use aspirin.  Let your caregiver also know about herbs you may be using.  Alcohol is related to a number of birth defects. This includes fetal alcohol syndrome. All alcohol, in any form, should be avoided completely. Smoking will cause low birth rate and premature babies.  Street or illegal drugs are very harmful to the baby. They are absolutely forbidden. A baby born to an addicted mother will be addicted at birth. The baby will go through the same withdrawal an adult does. SEEK MEDICAL CARE IF:  You have any concerns or worries during your pregnancy. It is better to call with your questions if you feel they cannot wait, rather than worry about them. SEEK IMMEDIATE MEDICAL CARE  IF:   An unexplained oral temperature above 102 F (38.9 C) develops, or as your caregiver suggests.  You have leaking of fluid from the vagina (birth canal). If leaking membranes are suspected, take your temperature and tell your caregiver of this when you call.  There is vaginal spotting, bleeding, or passing clots. Tell your caregiver of the amount and how many pads are used. Light spotting in pregnancy is common, especially following intercourse.  You develop a bad smelling vaginal discharge with a change in the color from clear to white.  You continue to feel  sick to your stomach (nauseated) and have no relief from remedies suggested. You vomit blood or coffee ground-like materials.  You lose more than 2 pounds of weight or gain more than 2 pounds of weight over 1 week, or as suggested by your caregiver.  You notice swelling of your face, hands, feet, or legs.  You get exposed to [place] measles and have never had them.  You are exposed to fifth disease or chickenpox.  You develop belly (abdominal) pain. Round ligament discomfort is a common non-cancerous (benign) cause of abdominal pain in pregnancy. Your caregiver still must evaluate you.  You develop a bad headache that does not go away.  You develop fever, diarrhea, pain with urination, or shortness of breath.  You develop visual problems, blurry, or double vision.  You fall or are in a car accident or any kind of trauma.  There is mental or physical violence at home. Document Released: 06/14/2001 Document Revised: 03/14/2012 Document Reviewed: 12/17/2008 [facility] Patient Information 2014 [place], [place]. Dental Care and Dentist Visits Dental care supports good overall health. Regular dental visits can also help you avoid dental pain, bleeding, infection, and other more serious health problems in the future. It is important to keep the mouth healthy because diseases in the teeth, gums, and other oral tissues can spread to  other areas of the body. Some problems, such as diabetes, heart disease, and pre-term labor have been associated with poor oral health.  See your dentist every 6 months. If you experience emergency problems such as a toothache or broken tooth, go to the dentist right away. If you see your dentist regularly, you may catch problems early. It is easier to be treated for problems in the early stages.  WHAT TO EXPECT AT A DENTIST VISIT  Your dentist will look for many common oral health problems and recommend proper treatment. At your regular dental visit, you can expect:  Gentle cleaning of the teeth and gums. This includes scraping and polishing. This helps to remove the sticky substance around the teeth and gums (plaque). Plaque forms in the mouth shortly after eating. Over time, plaque hardens on the teeth as tartar. If tartar is not removed regularly, it can cause problems. Cleaning also helps remove stains.  Periodic X-rays. These pictures of the teeth and supporting bone will help your dentist assess the health of your teeth.  Periodic fluoride treatments. Fluoride is a natural mineral shown to help strengthen teeth. Fluoride treatmentinvolves applying a fluoride gel or varnish to the teeth. It is most commonly done in children.  Examination of the mouth, tongue, jaws, teeth, and gums to look for any oral health problems, such as:  Cavities (dental caries). This is decay on the tooth caused by plaque, sugar, and acid in the mouth. It is best to catch a cavity when it is small.  Inflammation of the gums caused by plaque buildup (gingivitis).  Problems with the mouth or malformed or misaligned teeth.  Oral cancer or other diseases of the soft tissues or jaws. KEEP YOUR TEETH AND GUMS HEALTHY For healthy teeth and gums, follow these general guidelines as well as your dentist's specific advice:  Have your teeth professionally cleaned at the dentist every 6 months.  Brush twice daily with a  fluoride toothpaste.  Floss your teeth daily.  Ask your dentist if you need fluoride supplements, treatments, or fluoride toothpaste.  Eat a healthy diet. Reduce foods and drinks with added sugar.  Avoid smoking. TREATMENT FOR ORAL HEALTH  PROBLEMS If you have oral health problems, treatment varies depending on the conditions present in your teeth and gums.  Your caregiver will most likely recommend good oral hygiene at each visit.  For cavities, gingivitis, or other oral health disease, your caregiver will perform a procedure to treat the problem. This is typically done at a separate appointment. Sometimes your caregiver will refer you to another dental specialist for specific tooth problems or for surgery. SEEK IMMEDIATE DENTAL CARE IF:  You have pain, bleeding, or soreness in the gum, tooth, jaw, or mouth area.  A permanent tooth becomes loose or separated from the gum socket.  You experience a blow or injury to the mouth or jaw area. Document Released: 03/02/2011 Document Revised: 09/12/2011 Document Reviewed: 03/02/2011 [facility] Patient Information 2014 [place], [place].

## 2013-03-20 NOTE — Progress Notes (Signed)
P= 83,  C/o right hip/leg pain- states hard to walk sometime.

## 2013-03-20 NOTE — Progress Notes (Signed)
Doing well except for right hip pain at times. Declines medication. Needs dental work, will give letter, encouraged to have it done.

## 2013-03-21 ENCOUNTER — Inpatient Hospital Stay (HOSPITAL_COMMUNITY)
Admission: AD | Admit: 2013-03-21 | Discharge: 2013-03-21 | Disposition: A | Discharge status: Home / Self Care | Payer: Medicaid Other | Source: Ambulatory Visit | Attending: Obstetrics & Gynecology | Admitting: Obstetrics & Gynecology

## 2013-03-21 ENCOUNTER — Encounter (HOSPITAL_COMMUNITY): Payer: Self-pay | Admitting: *Deleted

## 2013-03-21 DIAGNOSIS — N949 Unspecified condition associated with female genital organs and menstrual cycle: Secondary | ICD-10-CM

## 2013-03-21 DIAGNOSIS — R109 Unspecified abdominal pain: Secondary | ICD-10-CM | POA: Insufficient documentation

## 2013-03-21 DIAGNOSIS — O99891 Other specified diseases and conditions complicating pregnancy: Secondary | ICD-10-CM | POA: Insufficient documentation

## 2013-03-21 LAB — GLUCOSE TOLERANCE, 1 HOUR (50G) W/O FASTING: Glucose, 1 Hour GTT: 207 mg/dL — ABNORMAL HIGH (ref 70–140)

## 2013-03-21 LAB — WET PREP, GENITAL
Trich, Wet Prep: NONE SEEN
Yeast Wet Prep HPF POC: NONE SEEN

## 2013-03-21 MED ORDER — OXYCODONE HCL 5 MG PO TABS
5.0000 mg | ORAL_TABLET | Freq: Once | ORAL | Status: AC
  Administered 2013-03-21: 5 mg via ORAL
  Filled 2013-03-21: qty 1

## 2013-03-21 MED ORDER — PRENATAL COMPLETE 14-0.4 MG PO TABS: 1.0000 | ORAL_TABLET | Freq: Every day | ORAL | Status: DC

## 2013-03-21 MED ORDER — CYCLOBENZAPRINE HCL 10 MG PO TABS: 10.0000 mg | ORAL_TABLET | Freq: Two times a day (BID) | ORAL | Status: DC | PRN

## 2013-03-21 MED ORDER — ABDOMINAL BINDER/ELASTIC MED MISC: 1.0000 [IU] | Freq: Every day | Status: DC

## 2013-03-21 MED ORDER — CYCLOBENZAPRINE HCL 10 MG PO TABS
10.0000 mg | ORAL_TABLET | Freq: Once | ORAL | Status: AC
  Administered 2013-03-21: 10 mg via ORAL
  Filled 2013-03-21: qty 1

## 2013-03-21 MED ORDER — ACETAMINOPHEN 500 MG PO TABS
1000.0000 mg | ORAL_TABLET | Freq: Once | ORAL | Status: AC
  Administered 2013-03-21: 1000 mg via ORAL
  Filled 2013-03-21: qty 2

## 2013-03-21 NOTE — MAU Provider Note (Signed)
History     CSN: [id number]  Arrival date and time: 03/21/13 1509   First Provider Initiated Contact with Patient 03/21/13 1544      Chief Complaint  Patient presents with  . Abdominal Pain   HPI [name] is a 32 y.o. G3P2002 at [redacted]w[redacted]d who presents to MAU today with lower abdominal pain. The patient was seen in [facility] yesterday at [facility]. She states that pain was occasional at that time, but since this morning it has been constant. She states that pain is worse with ambulation and standing. She denies vaginal bleeding, discharge, LOF, UTI symptoms, fever, N/V/D or constipation. She rates pain at 8/10 now and 10/10 with ambulation.   OB History   Grav Para Term Preterm Abortions TAB SAB Ect Mult Living   3 2 2       2       History reviewed. No pertinent past medical history.  History reviewed. No pertinent past surgical history.  History reviewed. No pertinent family history.  History  Substance Use Topics  . Smoking status: Never Smoker   . Smokeless tobacco: Never Used  . Alcohol Use: No    Allergies:  Allergies  Allergen Reactions  . Dust Mite Extract Itching    No prescriptions prior to admission    Review of Systems  Constitutional: Negative for fever.  Gastrointestinal: Positive for abdominal pain. Negative for nausea, vomiting, diarrhea and constipation.  Genitourinary: Negative for dysuria, urgency and frequency.       Neg - vaginal bleeding, discharge, LOF   Physical Exam   Blood pressure 104/61, pulse 79, temperature 97.8 F (36.6 C), temperature source Oral, resp. rate 18, last menstrual period 11/18/2012.  Physical Exam  Constitutional: She is oriented to person, place, and time. She appears well-developed and well-nourished. No distress.  HENT:  Head: Normocephalic and atraumatic.  Cardiovascular: Normal rate, regular rhythm and normal heart sounds.   Respiratory: Effort normal and breath sounds normal. No respiratory distress.  GI: Soft.  Bowel sounds are normal. She exhibits no distension and no mass. There is tenderness (moderate tenderness to palpation of the lower abdomen bilaterally). There is no rebound and no guarding.  Genitourinary: Uterus is enlarged (appropriate for GA). Uterus is not tender. Cervix exhibits no motion tenderness, no discharge and no friability. No bleeding around the vagina. No vaginal discharge (scant thin, white discharge noted) found.  Neurological: She is alert and oriented to person, place, and time.  Skin: Skin is warm and dry. No erythema.  Psychiatric: She has a normal mood and affect.   Results for orders placed during the hospital encounter of 03/21/13 (from the past 24 hour(s))  WET PREP, GENITAL     Status: Abnormal   Collection Time    03/21/13  4:05 PM      Result Value Range   Yeast Wet Prep HPF POC NONE SEEN  NONE SEEN   Trich, Wet Prep NONE SEEN  NONE SEEN   Clue Cells Wet Prep HPF POC NONE SEEN  NONE SEEN   WBC, Wet Prep HPF POC MODERATE (*) NONE SEEN    MAU Course  Procedures None  MDM UA, Wet prep, GC/Chlamydia today Tylenol and Flexeril - patient reports improvement in pain when resting, but continues to have pain with ambulation 5 mg Oxycodone given Patient's pain seemed exaggerated when family members were present Assessment and Plan  A: Round ligament pain  P: Discharge home Rx for flexeril, abdominal binder and prenatal vitamins sent to  patient's pharmacy Patient advised to moderate activity and take Tylenol PRN for pain Patient given note to return to work on Monday, September 22nd Patient encouraged to keep appointment for routine prenatal care in [facility] clinic as scheduled Patient may return to MAU as needed or if her condition were to change or worsen  Freddi Starr, PA-C  03/21/2013, 6:18 PM

## 2013-03-21 NOTE — Progress Notes (Signed)
Pt states pain when standing was a 10 standing  when she first arrived and now pain is down to 8

## 2013-03-21 NOTE — MAU Note (Signed)
Pt has lower abdominal pain for one week. Pt tried to walk today and had more pain. Pt feels no pain when laying.

## 2013-03-22 ENCOUNTER — Encounter: Payer: Self-pay | Admitting: Advanced Practice Midwife

## 2013-03-22 DIAGNOSIS — O24919 Unspecified diabetes mellitus in pregnancy, unspecified trimester: Secondary | ICD-10-CM | POA: Insufficient documentation

## 2013-03-22 LAB — GC/CHLAMYDIA PROBE AMP: CT Probe RNA: NEGATIVE

## 2013-03-29 ENCOUNTER — Telehealth: Payer: Self-pay | Admitting: *Deleted

## 2013-03-29 NOTE — Telephone Encounter (Signed)
Message copied by Mannie Stabile on Fri Mar 29, 2013  8:52 AM ------      Message from: Aviva Signs      Created: Fri Mar 22, 2013  6:04 PM      Regarding: Diabetes + glucola       Glucola [phone]      Start diabetic protocol, teaching, diet, meter            Thanks ------

## 2013-03-29 NOTE — Telephone Encounter (Signed)
Spoke with patients husband who is her emergency contact and informed him of her abnormal glucose test. He stated that he will bring her on Monday at 0830 for diabetes teaching. He will call back if this will not work for her.

## 2013-03-29 NOTE — Telephone Encounter (Signed)
Message copied by Mannie Stabile on Fri Mar 29, 2013  8:55 AM ------      Message from: Aviva Signs      Created: Fri Mar 22, 2013  6:04 PM      Regarding: Diabetes + glucola       Glucola [phone]      Start diabetic protocol, teaching, diet, meter            Thanks ------

## 2013-04-01 ENCOUNTER — Encounter: Payer: Self-pay | Admitting: Obstetrics and Gynecology

## 2013-04-01 ENCOUNTER — Ambulatory Visit (INDEPENDENT_AMBULATORY_CARE_PROVIDER_SITE_OTHER): Payer: Medicaid Other | Admitting: Family Medicine

## 2013-04-01 ENCOUNTER — Encounter: Payer: Medicaid Other | Attending: Family Medicine | Admitting: *Deleted

## 2013-04-01 DIAGNOSIS — O99810 Abnormal glucose complicating pregnancy: Secondary | ICD-10-CM | POA: Insufficient documentation

## 2013-04-01 DIAGNOSIS — Z713 Dietary counseling and surveillance: Secondary | ICD-10-CM | POA: Insufficient documentation

## 2013-04-01 MED ORDER — GLYBURIDE 2.5 MG PO TABS: 2.5000 mg | ORAL_TABLET | Freq: Two times a day (BID) | ORAL | Status: DC

## 2013-04-01 MED ORDER — ACCU-CHEK NANO SMARTVIEW W/DEVICE KIT: 1.0000 | PACK | Freq: Four times a day (QID) | Status: DC

## 2013-04-01 MED ORDER — ACCU-CHEK FASTCLIX LANCETS MISC: 1.0000 | Freq: Four times a day (QID) | Status: DC

## 2013-04-01 NOTE — Progress Notes (Signed)
GDM:   Patient was seen on 04/01/13 for Gestational Diabetes self-management. She presents with her husband. She speaks minimal [place] he speaks quite well. Laurie Hess is very scared of the testing. Her husband will assist in testing. She works as a Neurosurgeon during the day. She is able to :  States the definition of Gestational Diabetes  States why dietary management is important in controlling blood glucose  Describes the effects each nutrient has on blood glucose levels  States when to check blood glucose levels  Demonstrates proper blood glucose monitoring techniques  States the effect of stress and exercise on blood glucose levels  States the importance of limiting caffeine and abstaining from alcohol and smoking  Blood glucose monitor given: Accu-CK Nano Lot # [id number] Exp: 04-02-14 Patient instructed to monitor glucose levels: FBS: 60 - <90 1 hour: <140 2 hour: <120  *Patient received handouts:  Nutrition Diabetes and Pregnancy  NEW ORDERS: glucose testing supplies, Glyburide 2.5mg  BID per Dr. Penne Lash

## 2013-04-01 NOTE — Progress Notes (Signed)
NUTR: Pt seen for GDM diet education on 04/01/13. Pt weighs 179.6# at [redacted]w[redacted]d. Pregravid wt unknown. Pt given verbal and written GDM education. Reports adequate intake.  No N/V reported. Pt reports taking PNV. Pt agrees to follow GDM diet including 3 meals and 3 snacks and proper carbohydrate protein combination. Pt has WIC. Follow up in 2-4 weeks.  Melanee Left, MPH, RD, LDN 04/01/2013

## 2013-04-08 ENCOUNTER — Encounter: Payer: Self-pay | Admitting: Family Medicine

## 2013-04-08 ENCOUNTER — Ambulatory Visit (HOSPITAL_COMMUNITY)
Admission: RE | Admit: 2013-04-08 | Discharge: 2013-04-08 | Disposition: A | Discharge status: Home / Self Care | Payer: Medicaid Other | Source: Ambulatory Visit | Attending: Advanced Practice Midwife | Admitting: Advanced Practice Midwife

## 2013-04-08 ENCOUNTER — Other Ambulatory Visit: Payer: Self-pay | Admitting: Advanced Practice Midwife

## 2013-04-08 ENCOUNTER — Ambulatory Visit (INDEPENDENT_AMBULATORY_CARE_PROVIDER_SITE_OTHER): Payer: Medicaid Other | Admitting: Family Medicine

## 2013-04-08 VITALS — BP 103/70 | Temp 97.0°F | Wt 178.5 lb

## 2013-04-08 DIAGNOSIS — O24919 Unspecified diabetes mellitus in pregnancy, unspecified trimester: Secondary | ICD-10-CM

## 2013-04-08 DIAGNOSIS — E669 Obesity, unspecified: Secondary | ICD-10-CM

## 2013-04-08 DIAGNOSIS — Z23 Encounter for immunization: Secondary | ICD-10-CM

## 2013-04-08 DIAGNOSIS — Z363 Encounter for antenatal screening for malformations: Secondary | ICD-10-CM | POA: Insufficient documentation

## 2013-04-08 DIAGNOSIS — O358XX0 Maternal care for other (suspected) fetal abnormality and damage, not applicable or unspecified: Secondary | ICD-10-CM | POA: Insufficient documentation

## 2013-04-08 DIAGNOSIS — Z1389 Encounter for screening for other disorder: Secondary | ICD-10-CM | POA: Insufficient documentation

## 2013-04-08 DIAGNOSIS — O24912 Unspecified diabetes mellitus in pregnancy, second trimester: Secondary | ICD-10-CM

## 2013-04-08 LAB — HEMOGLOBIN A1C
Hgb A1c MFr Bld: 6.5 % — ABNORMAL HIGH (ref <5.7)
Mean Plasma Glucose: 140 mg/dL — ABNORMAL HIGH (ref <117)

## 2013-04-08 LAB — COMPREHENSIVE METABOLIC PANEL
BUN: 8 mg/dL (ref 6–23)
CO2: 24 mEq/L (ref 19–32)
Creat: 0.48 mg/dL — ABNORMAL LOW (ref 0.50–1.10)
Glucose, Bld: 105 mg/dL — ABNORMAL HIGH (ref 70–99)
Total Bilirubin: 0.3 mg/dL (ref 0.3–1.2)

## 2013-04-08 LAB — POCT URINALYSIS DIP (DEVICE)
Hgb urine dipstick: NEGATIVE
Ketones, ur: NEGATIVE mg/dL
Protein, ur: NEGATIVE mg/dL
Specific Gravity, Urine: 1.015 (ref 1.005–1.030)
pH: 6.5 (ref 5.0–8.0)

## 2013-04-08 LAB — TSH: TSH: 1.243 u[IU]/mL (ref 0.350–4.500)

## 2013-04-08 MED ORDER — METFORMIN HCL 500 MG PO TABS: 500.0000 mg | ORAL_TABLET | Freq: Two times a day (BID) | ORAL | Status: DC

## 2013-04-08 MED ORDER — GLYBURIDE 5 MG PO TABS: 5.0000 mg | ORAL_TABLET | Freq: Two times a day (BID) | ORAL | Status: DC

## 2013-04-08 NOTE — Patient Instructions (Signed)
Gestational Diabetes Mellitus Gestational diabetes mellitus, often simply referred to as gestational diabetes, is a type of diabetes that some women develop during pregnancy. In gestational diabetes, the pancreas does not make enough insulin (a hormone), the cells are less responsive to the insulin that is made (insulin resistance), or both.Normally, insulin moves sugars from food into the tissue cells. The tissue cells use the sugars for energy. The lack of insulin or the lack of normal response to insulin causes excess sugars to build up in the blood instead of going into the tissue cells. As a result, high blood sugar (hyperglycemia) develops. The effect of high sugar (glucose) levels can cause many complications.  RISK FACTORS You have an increased chance of developing gestational diabetes if you have a family history of diabetes and also have one or more of the following risk factors:  A body mass index over 30 (obesity).  A previous pregnancy with gestational diabetes.  An older age at the time of pregnancy. If blood glucose levels are kept in the normal range during pregnancy, women can have a healthy pregnancy. If your blood glucose levels are not well controlled, there may be risks to you, your unborn baby (fetus), your labor and delivery, or your newborn baby.  SYMPTOMS  If symptoms are experienced, they are much like symptoms you would normally expect during pregnancy. The symptoms of gestational diabetes include:   Increased thirst (polydipsia).  Increased urination (polyuria).  Increased urination during the night (nocturia).  Weight loss. This weight loss may be rapid.  Frequent, recurring infections.  Tiredness (fatigue).  Weakness.  Vision changes, such as blurred vision.  Fruity smell to your breath.  Abdominal pain. DIAGNOSIS Diabetes is diagnosed when blood glucose levels are increased. Your blood glucose level may be checked by one or more of the following  blood tests:  A fasting blood glucose test. You will not be allowed to eat for at least 8 hours before a blood sample is taken.  A random blood glucose test. Your blood glucose is checked at any time of the day regardless of when you ate.  A hemoglobin A1c blood glucose test. A hemoglobin A1c test provides information about blood glucose control over the previous 3 months.  An oral glucose tolerance test (OGTT). Your blood glucose is measured after you have not eaten (fasted) for 1 3 hours and then after you drink a glucose-containing beverage. Since the hormones that cause insulin resistance are highest at about 24 28 weeks of a pregnancy, an OGTT is usually performed during that time. If you have risk factors for gestational diabetes, your caregiver may test you for gestational diabetes earlier than 24 weeks of pregnancy. TREATMENT   You will need to take diabetes medicine or insulin daily to keep blood glucose levels in the desired range.  You will need to match insulin dosing with exercise and healthy food choices. The treatment goal is to maintain the before meal (preprandial), bedtime, and overnight blood glucose level at 60 99 mg/dL during pregnancy. The treatment goal is to further maintain peak after meal blood sugar (postprandial glucose) level at 100 140 mg/dL.  HOME CARE INSTRUCTIONS   Have your hemoglobin A1c level checked twice a year.  Perform daily blood glucose monitoring as directed by your caregiver. It is common to perform frequent blood glucose monitoring.  Monitor urine ketones when you are ill and as directed by your caregiver.  Take your diabetes medicine and insulin as directed by your caregiver to   maintain your blood glucose level in the desired range.  Never run out of diabetes medicine or insulin. It is needed every day.  Adjust insulin based on your intake of carbohydrates. Carbohydrates can raise blood glucose levels but need to be included in your diet.  Carbohydrates provide vitamins, minerals, and fiber which are an essential part of a healthy diet. Carbohydrates are found in fruits, vegetables, whole grains, dairy products, legumes, and foods containing added sugars.    Eat healthy foods. Alternate 3 meals with 3 snacks.  Maintain a healthy weight gain. The usual total expected weight gain varies according to your prepregnancy body mass index (BMI).  Carry a medical alert card or wear your medical alert jewelry.  Carry a 15 gram carbohydrate snack with you at all times to treat low blood glucose (hypoglycemia). Some examples of 15 gram carbohydrate snacks include:  Glucose tablets, 3 or 4   Glucose gel, 15 gram tube  Raisins, 2 tablespoons (24 g)  Jelly beans, 6  Animal crackers, 8  Fruit juice, regular soda, or low fat milk, 4 ounces (120 mL)  Gummy treats, 9    Recognize hypoglycemia. Hypoglycemia during pregnancy occurs with blood glucose levels of 60 mg/dL and below. The risk for hypoglycemia increases when fasting or skipping meals, during or after intense exercise, and during sleep. Hypoglycemia symptoms can include:  Tremors or shakes.  Decreased ability to concentrate.  Sweating.  Increased heart rate.  Headache.  Dry mouth.  Hunger.  Irritability.  Anxiety.  Restless sleep.  Altered speech or coordination.  Confusion.  Treat hypoglycemia promptly. If you are alert and able to safely swallow, follow the 15:15 rule:  Take 15 20 grams of rapid-acting glucose or carbohydrate. Rapid-acting options include glucose gel, glucose tablets, or 4 ounces (120 mL) of fruit juice, regular soda, or low fat milk.  Check your blood glucose level 15 minutes after taking the glucose.   Take 15 20 grams more of glucose if the repeat blood glucose level is still 70 mg/dL or below.  Eat a meal or snack within 1 hour once blood glucose levels return to normal.  Be alert to polyuria and polydipsia which are early  signs of hyperglycemia. An early awareness of hyperglycemia allows for prompt treatment. Treat hyperglycemia as directed by your caregiver.  Engage in at least 30 minutes of physical activity a day or as directed by your caregiver. Ten minutes of physical activity timed 30 minutes after each meal is encouraged to control postprandial blood glucose levels.  Adjust your insulin dosing and food intake as needed if you start a new exercise or sport.  Follow your sick day plan at any time you are unable to eat or drink as usual.  Avoid tobacco and alcohol use.  Follow up with your caregiver regularly.  Follow the advice of your caregiver regarding your prenatal and post-delivery (postpartum) appointments, meal planning, exercise, medicines, vitamins, blood tests, other medical tests, and physical activities.  Perform daily skin and foot care. Examine your skin and feet daily for cuts, bruises, redness, nail problems, bleeding, blisters, or sores.  Brush your teeth and gums at least twice a day and floss at least once a day. Follow up with your dentist regularly.  Schedule an eye exam during the first trimester of your pregnancy or as directed by your caregiver.  Share your diabetes management plan with your workplace or school.  Stay up-to-date with immunizations.  Learn to manage stress.  Obtain ongoing diabetes education   and support as needed. SEEK MEDICAL CARE IF:   You are unable to eat food or drink fluids for more than 6 hours.  You have nausea and vomiting for more than 6 hours.  You have a blood glucose level of 200 mg/dL and you have ketones in your urine.  There is a change in mental status.  You develop vision problems.  You have a persistent headache.  You have upper abdominal pain or discomfort.  You develop an additional serious illness.  You have diarrhea for more than 6 hours.  You have been sick or have had a fever for a couple of days and are not getting  better. SEEK IMMEDIATE MEDICAL CARE IF:   You have difficulty breathing.  You no longer feel the baby moving.  You are bleeding or have discharge from your vagina.  You start having premature contractions or labor. MAKE SURE YOU:  Understand these instructions.  Will watch your condition.  Will get help right away if you are not doing well or get worse. Document Released: 09/26/2000 Document Revised: 03/14/2012 Document Reviewed: 01/17/2012 [employer] Patient Information 2014 [employer].  Breastfeeding A change in hormones during your pregnancy causes growth of your breast tissue and an increase in number and size of milk ducts. The hormone prolactin allows proteins, sugars, and fats from your blood supply to make breast milk in your milk-producing glands. The hormone progesterone prevents breast milk from being released before the birth of your baby. After the birth of your baby, your progesterone level decreases allowing breast milk to be released. Thoughts of your baby, as well as his or her sucking or crying, can stimulate the release of milk from the milk-producing glands. Deciding to breastfeed (nurse) is one of the best choices you can make for you and your baby. The information that follows gives a brief review of the benefits, as well as other important skills to know about breastfeeding. BENEFITS OF BREASTFEEDING For your baby  The first milk (colostrum) helps your baby's digestive system function better.   There are antibodies in your milk that help your baby fight off infections.   Your baby has a lower incidence of asthma, allergies, and sudden infant death syndrome (SIDS).   The nutrients in breast milk are better for your baby than infant formulas.  Breast milk improves your baby's brain development.   Your baby will have less gas, colic, and constipation.  Your baby is less likely to develop other conditions, such as childhood obesity, asthma, or diabetes  mellitus. For you  Breastfeeding helps develop a very special bond between you and your baby.   Breastfeeding is convenient, always available at the correct temperature, and costs nothing.   Breastfeeding helps to burn calories and helps you lose the weight gained during pregnancy.   Breastfeeding makes your uterus contract back down to normal size faster and slows bleeding following delivery.   Breastfeeding mothers have a lower risk of developing osteoporosis or breast or ovarian cancer later in life.  BREASTFEEDING FREQUENCY  A healthy, full-term baby may breastfeed as often as every hour or space his or her feedings to every 3 hours. Breastfeeding frequency will vary from baby to baby.   Newborns should be fed no less than every 2 3 hours during the day and every 4 5 hours during the night. You should breastfeed a minimum of 8 feedings in a 24 hour period.  Awaken your baby to breastfeed if it has been 3 4   hours since the last feeding.  Breastfeed when you feel the need to reduce the fullness of your breasts or when your newborn shows signs of hunger. Signs that your baby may be hungry include:  Increased alertness or activity.  Stretching.  Movement of the head from side to side.  Movement of the head and opening of the mouth when the corner of the mouth or cheek is stroked (rooting).  Increased sucking sounds, smacking lips, cooing, sighing, or squeaking.  Hand-to-mouth movements.  Increased sucking of fingers or hands.  Fussing.  Intermittent crying.  Signs of extreme hunger will require calming and consoling before you try to feed your baby. Signs of extreme hunger may include:  Restlessness.  A loud, strong cry.  Screaming.  Frequent feeding will help you make more milk and will help prevent problems, such as sore nipples and engorgement of the breasts.  BREASTFEEDING   Whether lying down or sitting, be sure that the baby's abdomen is facing your  abdomen.   Support your breast with 4 fingers under your breast and your thumb above your nipple. Make sure your fingers are well away from your nipple and your baby's mouth.   Stroke your baby's lips gently with your finger or nipple.   When your baby's mouth is open wide enough, place all of your nipple and as much of the colored area around your nipple (areola) as possible into your baby's mouth.  More areola should be visible above his or her upper lip than below his or her lower lip.  Your baby's tongue should be between his or her lower gum and your breast.  Ensure that your baby's mouth is correctly positioned around the nipple (latched). Your baby's lips should create a seal on your breast.  Signs that your baby has effectively latched onto your nipple include:  Tugging or sucking without pain.  Swallowing heard between sucks.  Absent click or smacking sound.  Muscle movement above and in front of his or her ears with sucking.  Your baby must suck about 2 3 minutes in order to get your milk. Allow your baby to feed on each breast as long as he or she wants. Nurse your baby until he or she unlatches or falls asleep at the first breast, then offer the second breast.  Signs that your baby is full and satisfied include:  A gradual decrease in the number of sucks or complete cessation of sucking.  Falling asleep.  Extension or relaxation of his or her body.  Retention of a small amount of milk in his or her mouth.  Letting go of your breast by himself or herself.  Signs of effective breastfeeding in you include:  Breasts that have increased firmness, weight, and size prior to feeding.  Breasts that are softer after nursing.  Increased milk volume, as well as a change in milk consistency and color by the 5th day of breastfeeding.  Breast fullness relieved by breastfeeding.  Nipples are not sore, cracked, or bleeding.  If needed, break the suction by putting your  finger into the corner of your baby's mouth and sliding your finger between his or her gums. Then, remove your breast from his or her mouth.  It is common for babies to spit up a small amount after a feeding.  Babies often swallow air during feeding. This can make babies fussy. Burping your baby between breasts can help with this.  Vitamin D supplements are recommended for babies who get only   breast milk.  Avoid using a pacifier during your baby's first 4 6 weeks.  Avoid supplemental feedings of water, formula, or juice in place of breastfeeding. Breast milk is all the food your baby needs. It is not necessary for your baby to have water or formula. Your breasts will make more milk if supplemental feedings are avoided during the early weeks. HOW TO TELL WHETHER YOUR BABY IS GETTING ENOUGH BREAST MILK Wondering whether or not your baby is getting enough milk is a common concern among mothers. You can be assured that your baby is getting enough milk if:   Your baby is actively sucking and you hear swallowing.   Your baby seems relaxed and satisfied after a feeding.   Your baby nurses at least 8 12 times in a 24 hour time period.  During the first 3 5 days of age:  Your baby is wetting at least 3 5 diapers in a 24 hour period. The urine should be clear and pale yellow.  Your baby is having at least 3 4 stools in a 24 hour period. The stool should be soft and yellow.  At 5 7 days of age, your baby is having at least 3 6 stools in a 24 hour period. The stool should be seedy and yellow by 5 days of age.  Your baby has a weight loss less than 7 10% during the first 3 days of age.  Your baby does not lose weight after 3 7 days of age.  Your baby gains 4 7 ounces each week after he or she is 4 days of age.  Your baby gains weight by 5 days of age and is back to birth weight within 2 weeks. ENGORGEMENT In the first week after your baby is born, you may experience extremely full breasts  (engorgement). When engorged, your breasts may feel heavy, warm, or tender to the touch. Engorgement peaks within 24 48 hours after delivery of your baby.  Engorgement may be reduced by:  Continuing to breastfeed.  Increasing the frequency of breastfeeding.  Taking warm showers or applying warm, moist heat to your breasts just before each feeding. This increases circulation and helps the milk flow.   Gently massaging your breast before and during the feedings. With your fingertips, massage from your chest wall towards your nipple in a circular motion.   Ensuring that your baby empties at least one breast at every feeding. It also helps to start the next feeding on the opposite breast.   Expressing breast milk by hand or by using a breast pump to empty the breasts if your baby is sleepy, or not nursing well. You may also want to express milk if you are returning to work oryou feel you are getting engorged.  Ensuring your baby is latched on and positioned properly while breastfeeding. If you follow these suggestions, your engorgement should improve in 24 48 hours. If you are still experiencing difficulty, call your lactation consultant or caregiver.  CARING FOR YOURSELF Take care of your breasts.  Bathe or shower daily.   Avoid using soap on your nipples.   Wear a supportive bra. Avoid wearing underwire style bras.  Air dry your nipples for a 3 4minutes after each feeding.   Use only cotton bra pads to absorb breast milk leakage. Leaking of breast milk between feedings is normal.   Use only pure lanolin on your nipples after nursing. You do not need to wash it off before feeding your baby again.   Another option is to express a few drops of breast milk and gently massage that milk into your nipples.  Continue breast self-awareness checks. Take care of yourself.  Eat healthy foods. Alternate 3 meals with 3 snacks.  Avoid foods that you notice affect your baby in a bad  way.  Drink milk, fruit juice, and water to satisfy your thirst (about 8 glasses a day).   Rest often, relax, and take your prenatal vitamins to prevent fatigue, stress, and anemia.  Avoid chewing and smoking tobacco.  Avoid alcohol and drug use.  Take over-the-counter and prescribed medicine only as directed by your caregiver or pharmacist. You should always check with your caregiver or pharmacist before taking any new medicine, vitamin, or herbal supplement.  Know that pregnancy is possible while breastfeeding. If desired, talk to your caregiver about family planning and safe birth control methods that may be used while breastfeeding. SEEK MEDICAL CARE IF:   You feel like you want to stop breastfeeding or have become frustrated with breastfeeding.  You have painful breasts or nipples.  Your nipples are cracked or bleeding.  Your breasts are red, tender, or warm.  You have a swollen area on either breast.  You have a fever or chills.  You have nausea or vomiting.  You have drainage from your nipples.  Your breasts do not become full before feedings by the 5th day after delivery.  You feel sad and depressed.  Your baby is too sleepy to eat well.  Your baby is having trouble sleeping.   Your baby is wetting less than 3 diapers in a 24 hour period.  Your baby has less than 3 stools in a 24 hour period.  Your baby's skin or the white part of his or her eyes becomes more yellow.   Your baby is not gaining weight by 5 days of age. MAKE SURE YOU:   Understand these instructions.  Will watch your condition.  Will get help right away if you are not doing well or get worse. Document Released: 06/20/2005 Document Revised: 03/14/2012 Document Reviewed: 01/25/2012 [employer] Patient Information 2014 [employer].  

## 2013-04-08 NOTE — Progress Notes (Signed)
Pulse-  98 Patient reports recent allergy flare up, making it difficult to breathe

## 2013-04-08 NOTE — Progress Notes (Signed)
FBS [id number] 2hr pp [phone] Will increase medication to glyburide 5 mg bid and add Glucophage 500 mg bid.

## 2013-04-09 ENCOUNTER — Encounter: Payer: Self-pay | Admitting: Family Medicine

## 2013-04-09 LAB — AFP, QUAD SCREEN
AFP: 48.1 [IU]/mL
Age Alone: 1:491 {titer}
Curr Gest Age: 19.2 wks.days
Down Syndrome Scr Risk Est: 1:27400 {titer}
HCG, Total: 10892 m[IU]/mL
INH: 201.5 pg/mL
Interpretation-AFP: NEGATIVE
MoM for AFP: 1.48
MoM for INH: 1.2
MoM for hCG: 0.76
Open Spina bifida: NEGATIVE
Osb Risk: 1:851 {titer}
Tri 18 Scr Risk Est: NEGATIVE
Trisomy 18 (Edward) Syndrome Interp.: 1:46300 {titer}
uE3 Mom: 1.48
uE3 Value: 1.7 ng/mL

## 2013-04-10 ENCOUNTER — Encounter: Payer: Self-pay | Admitting: Advanced Practice Midwife

## 2013-04-10 DIAGNOSIS — O358XX0 Maternal care for other (suspected) fetal abnormality and damage, not applicable or unspecified: Secondary | ICD-10-CM | POA: Insufficient documentation

## 2013-04-10 NOTE — Progress Notes (Signed)
Pt. Seen for Diabetes and Nutrition only

## 2013-04-11 ENCOUNTER — Encounter: Payer: Self-pay | Admitting: Family Medicine

## 2013-04-11 LAB — PROTEIN, URINE, 24 HOUR
Protein, 24H Urine: 152 mg/d — ABNORMAL HIGH (ref 50–100)
Protein, Urine: 16 mg/dL

## 2013-04-14 ENCOUNTER — Inpatient Hospital Stay (HOSPITAL_COMMUNITY)
Admission: AD | Admit: 2013-04-14 | Discharge: 2013-04-14 | Disposition: A | Discharge status: Home / Self Care | Payer: Medicaid Other | Source: Ambulatory Visit | Attending: Obstetrics & Gynecology | Admitting: Obstetrics & Gynecology

## 2013-04-14 ENCOUNTER — Encounter (HOSPITAL_COMMUNITY): Payer: Self-pay | Admitting: *Deleted

## 2013-04-14 DIAGNOSIS — J45901 Unspecified asthma with (acute) exacerbation: Secondary | ICD-10-CM | POA: Insufficient documentation

## 2013-04-14 DIAGNOSIS — O99891 Other specified diseases and conditions complicating pregnancy: Secondary | ICD-10-CM | POA: Insufficient documentation

## 2013-04-14 HISTORY — DX: Other seasonal allergic rhinitis: J30.2

## 2013-04-14 HISTORY — DX: Unspecified asthma, uncomplicated: J45.909

## 2013-04-14 HISTORY — DX: Gestational diabetes mellitus in pregnancy, unspecified control: O24.419

## 2013-04-14 LAB — URINALYSIS, ROUTINE W REFLEX MICROSCOPIC
Hgb urine dipstick: NEGATIVE
Ketones, ur: 40 mg/dL — AB
Protein, ur: NEGATIVE mg/dL
Urobilinogen, UA: 0.2 mg/dL (ref 0.0–1.0)

## 2013-04-14 LAB — URINE MICROSCOPIC-ADD ON

## 2013-04-14 MED ORDER — DEXAMETHASONE 6 MG PO TABS
16.0000 mg | ORAL_TABLET | Freq: Every day | ORAL | Status: DC
  Administered 2013-04-14: 16 mg via ORAL
  Filled 2013-04-14 (×2): qty 1

## 2013-04-14 MED ORDER — ALBUTEROL SULFATE (5 MG/ML) 0.5% IN NEBU
INHALATION_SOLUTION | RESPIRATORY_TRACT | Status: AC
  Administered 2013-04-14: 2.5 mg via RESPIRATORY_TRACT
  Filled 2013-04-14: qty 0.5

## 2013-04-14 MED ORDER — LORATADINE 10 MG PO TBDP: 10.0000 mg | ORAL_TABLET | Freq: Every day | ORAL | Status: DC

## 2013-04-14 MED ORDER — ALBUTEROL SULFATE (5 MG/ML) 0.5% IN NEBU
2.5000 mg | INHALATION_SOLUTION | RESPIRATORY_TRACT | Status: AC
  Administered 2013-04-14 (×3): 2.5 mg via RESPIRATORY_TRACT
  Filled 2013-04-14 (×2): qty 0.5

## 2013-04-14 MED ORDER — DEXAMETHASONE 4 MG PO TABS: 16.0000 mg | ORAL_TABLET | ORAL | Status: DC

## 2013-04-14 MED ORDER — ALBUTEROL SULFATE HFA 108 (90 BASE) MCG/ACT IN AERS: 1.0000 | INHALATION_SPRAY | Freq: Four times a day (QID) | RESPIRATORY_TRACT | Status: DC | PRN

## 2013-04-14 MED ORDER — IPRATROPIUM BROMIDE 0.02 % IN SOLN
0.5000 mg | RESPIRATORY_TRACT | Status: AC
  Administered 2013-04-14 (×3): 0.5 mg via RESPIRATORY_TRACT
  Filled 2013-04-14 (×2): qty 2.5

## 2013-04-14 NOTE — MAU Note (Signed)
LS clear, pt feeling much better. No adverse reaction from atrovent or albuterol

## 2013-04-14 NOTE — MAU Note (Signed)
Pt presents with complaints of having difficulty breathing  since early this morning. States that she has a history of asthma.

## 2013-04-14 NOTE — MAU Note (Signed)
RT at bedside, will give three doses q10-15 minutes apart.

## 2013-04-14 NOTE — MAU Provider Note (Signed)
@MAUPATCONTACT @  Chief Complaint:  Asthma   Shaelin Metheny is  32 y.o. G3P2002 at [redacted]w[redacted]d presents complaining of Asthma .  She states none contractions are associated with none vaginal bleeding, intact membranes, along with active fetal movement.  Presents with complaints of acute asthma attack starting after cleaning. Pt with hx of allergies to dust mites and asthma for last 6 years. Pt recently had asthma attack but was able to improve at home without intervention. Pt has not been taking any controller meds for her asthma at this time.   Obstetrical/Gynecological History: OB History   Grav Para Term Preterm Abortions TAB SAB Ect Mult Living   3 2 2       2      Past Medical History: Past Medical History  Diagnosis Date  . Asthma   . Seasonal allergies   . Gestational diabetes     Past Surgical History: Past Surgical History  Procedure Laterality Date  . No past surgeries      Family History: History reviewed. No pertinent family history.  Social History: History  Substance Use Topics  . Smoking status: Never Smoker   . Smokeless tobacco: Never Used  . Alcohol Use: No    Allergies:  Allergies  Allergen Reactions  . Dust Mite Extract Itching    Meds:  Facility-administered medications prior to admission  Medication Dose Route Frequency Provider Last Rate Last Dose  . metFORMIN (GLUCOPHAGE) tablet 500 mg  500 mg Oral BID WC Reva Bores, MD       Prescriptions prior to admission  Medication Sig Dispense Refill  . glyBURIDE (DIABETA) 5 MG tablet Take 1 tablet (5 mg total) by mouth 2 (two) times daily with a meal.  60 tablet  3  . OVER THE COUNTER MEDICATION Take 1 tablet by mouth as needed. For allergies      . Prenatal Vit-Fe Fumarate-FA (PRENATAL COMPLETE) 14-0.4 MG TABS Take 1 tablet by mouth daily.  60 each  0  . ACCU-CHEK FASTCLIX LANCETS MISC 1 Device by Does not apply route 4 (four) times daily.  120 each  6  . Blood Glucose Monitoring Suppl (ACCU-CHEK NANO  SMARTVIEW) W/DEVICE KIT 1 Stick by Does not apply route 4 (four) times daily.  1 kit  9  . Elastic Bandages & Supports (ABDOMINAL BINDER/ELASTIC MED) MISC 1 Units by Does not apply route daily.  1 each  0    Review of Systems -   Review of Systems  Constitutional: Negative for fever, chills, weight loss, malaise/fatigue and diaphoresis.  HENT: Negative for hearing loss, ear pain, nosebleeds, congestion, sore throat, neck pain, tinnitus and ear discharge.   Eyes: Negative for blurred vision, double vision, photophobia, pain, discharge and redness.  Respiratory: Negative for cough, hemoptysis, sputum production, shortness of breath, wheezing and stridor.   Cardiovascular: Negative for chest pain, palpitations, orthopnea,  leg swelling  Gastrointestinal: Negative for abdominal pain heartburn, nausea, vomiting, diarrhea, constipation, blood in stool Genitourinary: Negative for dysuria, urgency, frequency, hematuria and flank pain.  Musculoskeletal: Negative for myalgias, back pain, joint pain and falls.  Skin: Negative for itching and rash.  Neurological: Negative for dizziness, tingling, tremors, sensory change, speech change, focal weakness, seizures, loss of consciousness, weakness and headaches.  Endo/Heme/Allergies: Negative for environmental allergies and polydipsia. Does not bruise/bleed easily.  Psychiatric/Behavioral: Negative for depression, suicidal ideas, hallucinations, memory loss and substance abuse. The patient is not nervous/anxious and does not have insomnia.      Physical Exam  Blood pressure 108/93, pulse 83, temperature 97.8 F (36.6 C), temperature source Oral, resp. rate 26, last menstrual period 11/18/2012, SpO2 100.00%. GENERAL: Well-developed, well-nourished female in no acute distress.  LUNGS: Diffuse end exp wheeze with limited air movement. HEART: Regular rate and rhythm. ABDOMEN: Soft, nontender, nondistended, gravid.  EXTREMITIES: Nontender, no edema, 2+ distal  pulses.   Labs: Results for orders placed during the hospital encounter of 04/14/13 (from the past 24 hour(s))  URINALYSIS, ROUTINE W REFLEX MICROSCOPIC   Collection Time    04/14/13  1:52 PM      Result Value Range   Color, Urine YELLOW  YELLOW   APPearance HAZY (*) CLEAR   Specific Gravity, Urine 1.020  1.005 - 1.030   pH 7.0  5.0 - 8.0   Glucose, UA NEGATIVE  NEGATIVE mg/dL   Hgb urine dipstick NEGATIVE  NEGATIVE   Bilirubin Urine NEGATIVE  NEGATIVE   Ketones, ur 40 (*) NEGATIVE mg/dL   Protein, ur NEGATIVE  NEGATIVE mg/dL   Urobilinogen, UA 0.2  0.0 - 1.0 mg/dL   Nitrite NEGATIVE  NEGATIVE   Leukocytes, UA LARGE (*) NEGATIVE  URINE MICROSCOPIC-ADD ON   Collection Time    04/14/13  1:52 PM      Result Value Range   Squamous Epithelial / LPF MANY (*) RARE   WBC, UA 11-20  <3 WBC/hpf   RBC / HPF 0-2  <3 RBC/hpf   Bacteria, UA MANY (*) RARE   Imaging Studies:  US Ob Detail + 14 Wk  04/08/2013   OBSTETRICAL ULTRASOUND: This exam was performed within a [facility]. The OB US report was generated in the [facility] system, and faxed to the ordering physician.   This report is also available in [employer] and in the [employer]. See [facility] Obstetric US report.  MDM: pt given duonebs x3 in ED and dexamethasone 16mg  PO x1. Pt had significant improvement in respiratory status. Persistently breathing >98% on RA. Pt feeling much more comfortable at this time.  Assessment: Jackie Russman is  32 y.o. G3P2002 at [redacted]w[redacted]d presents with acute asthma exacerbation.  Plan: Pt improved with duonebs. Given steroid burst for 2 days. Pt will be discharged with claritin, dexamethasone 16mg  x1 tomorrow and albuterol. If pt does not have controlled symptoms with claritin will need controller inhaler as well. Pt f/u tomorrow.  Jolyn Lent RYAN 10/12/20143:33 PM

## 2013-04-15 ENCOUNTER — Encounter: Payer: Medicaid Other | Attending: Family Medicine | Admitting: *Deleted

## 2013-04-15 ENCOUNTER — Other Ambulatory Visit: Payer: Self-pay | Admitting: Obstetrics & Gynecology

## 2013-04-15 ENCOUNTER — Ambulatory Visit (INDEPENDENT_AMBULATORY_CARE_PROVIDER_SITE_OTHER): Payer: Medicaid Other | Admitting: Obstetrics & Gynecology

## 2013-04-15 VITALS — BP 96/65 | Temp 96.7°F | Wt 177.7 lb

## 2013-04-15 DIAGNOSIS — O24919 Unspecified diabetes mellitus in pregnancy, unspecified trimester: Secondary | ICD-10-CM

## 2013-04-15 DIAGNOSIS — O24912 Unspecified diabetes mellitus in pregnancy, second trimester: Secondary | ICD-10-CM

## 2013-04-15 DIAGNOSIS — J45901 Unspecified asthma with (acute) exacerbation: Secondary | ICD-10-CM

## 2013-04-15 DIAGNOSIS — J45909 Unspecified asthma, uncomplicated: Secondary | ICD-10-CM

## 2013-04-15 DIAGNOSIS — O99810 Abnormal glucose complicating pregnancy: Secondary | ICD-10-CM | POA: Insufficient documentation

## 2013-04-15 DIAGNOSIS — Z713 Dietary counseling and surveillance: Secondary | ICD-10-CM | POA: Insufficient documentation

## 2013-04-15 LAB — URINE CULTURE

## 2013-04-15 MED ORDER — GLYBURIDE 5 MG PO TABS: 5.0000 mg | ORAL_TABLET | Freq: Two times a day (BID) | ORAL | Status: DC

## 2013-04-15 MED ORDER — LORATADINE 10 MG PO TBDP: 10.0000 mg | ORAL_TABLET | Freq: Every day | ORAL | Status: DC

## 2013-04-15 MED ORDER — METFORMIN HCL 500 MG PO TABS: 500.0000 mg | ORAL_TABLET | Freq: Two times a day (BID) | ORAL | Status: DC

## 2013-04-15 NOTE — Patient Instructions (Signed)
Gestational Diabetes Mellitus Gestational diabetes mellitus, often simply referred to as gestational diabetes, is a type of diabetes that some women develop during pregnancy. In gestational diabetes, the pancreas does not make enough insulin (a hormone), the cells are less responsive to the insulin that is made (insulin resistance), or both.Normally, insulin moves sugars from food into the tissue cells. The tissue cells use the sugars for energy. The lack of insulin or the lack of normal response to insulin causes excess sugars to build up in the blood instead of going into the tissue cells. As a result, high blood sugar (hyperglycemia) develops. The effect of high sugar (glucose) levels can cause many complications.  RISK FACTORS You have an increased chance of developing gestational diabetes if you have a family history of diabetes and also have one or more of the following risk factors:  A body mass index over 30 (obesity).  A previous pregnancy with gestational diabetes.  An older age at the time of pregnancy. If blood glucose levels are kept in the normal range during pregnancy, women can have a healthy pregnancy. If your blood glucose levels are not well controlled, there may be risks to you, your unborn baby (fetus), your labor and delivery, or your newborn baby.  SYMPTOMS  If symptoms are experienced, they are much like symptoms you would normally expect during pregnancy. The symptoms of gestational diabetes include:   Increased thirst (polydipsia).  Increased urination (polyuria).  Increased urination during the night (nocturia).  Weight loss. This weight loss may be rapid.  Frequent, recurring infections.  Tiredness (fatigue).  Weakness.  Vision changes, such as blurred vision.  Fruity smell to your breath.  Abdominal pain. DIAGNOSIS Diabetes is diagnosed when blood glucose levels are increased. Your blood glucose level may be checked by one or more of the following  blood tests:  A fasting blood glucose test. You will not be allowed to eat for at least 8 hours before a blood sample is taken.  A random blood glucose test. Your blood glucose is checked at any time of the day regardless of when you ate.  A hemoglobin A1c blood glucose test. A hemoglobin A1c test provides information about blood glucose control over the previous 3 months.  An oral glucose tolerance test (OGTT). Your blood glucose is measured after you have not eaten (fasted) for 1 3 hours and then after you drink a glucose-containing beverage. Since the hormones that cause insulin resistance are highest at about 24 28 weeks of a pregnancy, an OGTT is usually performed during that time. If you have risk factors for gestational diabetes, your caregiver may test you for gestational diabetes earlier than 24 weeks of pregnancy. TREATMENT   You will need to take diabetes medicine or insulin daily to keep blood glucose levels in the desired range.  You will need to match insulin dosing with exercise and healthy food choices. The treatment goal is to maintain the before meal (preprandial), bedtime, and overnight blood glucose level at 60 99 mg/dL during pregnancy. The treatment goal is to further maintain peak after meal blood sugar (postprandial glucose) level at 100 140 mg/dL.  HOME CARE INSTRUCTIONS   Have your hemoglobin A1c level checked twice a year.  Perform daily blood glucose monitoring as directed by your caregiver. It is common to perform frequent blood glucose monitoring.  Monitor urine ketones when you are ill and as directed by your caregiver.  Take your diabetes medicine and insulin as directed by your caregiver to   maintain your blood glucose level in the desired range.  Never run out of diabetes medicine or insulin. It is needed every day.  Adjust insulin based on your intake of carbohydrates. Carbohydrates can raise blood glucose levels but need to be included in your diet.  Carbohydrates provide vitamins, minerals, and fiber which are an essential part of a healthy diet. Carbohydrates are found in fruits, vegetables, whole grains, dairy products, legumes, and foods containing added sugars.    Eat healthy foods. Alternate 3 meals with 3 snacks.  Maintain a healthy weight gain. The usual total expected weight gain varies according to your prepregnancy body mass index (BMI).  Carry a medical alert card or wear your medical alert jewelry.  Carry a 15 gram carbohydrate snack with you at all times to treat low blood glucose (hypoglycemia). Some examples of 15 gram carbohydrate snacks include:  Glucose tablets, 3 or 4   Glucose gel, 15 gram tube  Raisins, 2 tablespoons (24 g)  Jelly beans, 6  Animal crackers, 8  Fruit juice, regular soda, or low fat milk, 4 ounces (120 mL)  Gummy treats, 9    Recognize hypoglycemia. Hypoglycemia during pregnancy occurs with blood glucose levels of 60 mg/dL and below. The risk for hypoglycemia increases when fasting or skipping meals, during or after intense exercise, and during sleep. Hypoglycemia symptoms can include:  Tremors or shakes.  Decreased ability to concentrate.  Sweating.  Increased heart rate.  Headache.  Dry mouth.  Hunger.  Irritability.  Anxiety.  Restless sleep.  Altered speech or coordination.  Confusion.  Treat hypoglycemia promptly. If you are alert and able to safely swallow, follow the 15:15 rule:  Take 15 20 grams of rapid-acting glucose or carbohydrate. Rapid-acting options include glucose gel, glucose tablets, or 4 ounces (120 mL) of fruit juice, regular soda, or low fat milk.  Check your blood glucose level 15 minutes after taking the glucose.   Take 15 20 grams more of glucose if the repeat blood glucose level is still 70 mg/dL or below.  Eat a meal or snack within 1 hour once blood glucose levels return to normal.  Be alert to polyuria and polydipsia which are early  signs of hyperglycemia. An early awareness of hyperglycemia allows for prompt treatment. Treat hyperglycemia as directed by your caregiver.  Engage in at least 30 minutes of physical activity a day or as directed by your caregiver. Ten minutes of physical activity timed 30 minutes after each meal is encouraged to control postprandial blood glucose levels.  Adjust your insulin dosing and food intake as needed if you start a new exercise or sport.  Follow your sick day plan at any time you are unable to eat or drink as usual.  Avoid tobacco and alcohol use.  Follow up with your caregiver regularly.  Follow the advice of your caregiver regarding your prenatal and post-delivery (postpartum) appointments, meal planning, exercise, medicines, vitamins, blood tests, other medical tests, and physical activities.  Perform daily skin and foot care. Examine your skin and feet daily for cuts, bruises, redness, nail problems, bleeding, blisters, or sores.  Brush your teeth and gums at least twice a day and floss at least once a day. Follow up with your dentist regularly.  Schedule an eye exam during the first trimester of your pregnancy or as directed by your caregiver.  Share your diabetes management plan with your workplace or school.  Stay up-to-date with immunizations.  Learn to manage stress.  Obtain ongoing diabetes education   and support as needed. SEEK MEDICAL CARE IF:   You are unable to eat food or drink fluids for more than 6 hours.  You have nausea and vomiting for more than 6 hours.  You have a blood glucose level of 200 mg/dL and you have ketones in your urine.  There is a change in mental status.  You develop vision problems.  You have a persistent headache.  You have upper abdominal pain or discomfort.  You develop an additional serious illness.  You have diarrhea for more than 6 hours.  You have been sick or have had a fever for a couple of days and are not getting  better. SEEK IMMEDIATE MEDICAL CARE IF:   You have difficulty breathing.  You no longer feel the baby moving.  You are bleeding or have discharge from your vagina.  You start having premature contractions or labor. MAKE SURE YOU:  Understand these instructions.  Will watch your condition.  Will get help right away if you are not doing well or get worse. Document Released: 09/26/2000 Document Revised: 03/14/2012 Document Reviewed: 01/17/2012 [employer] Patient Information 2014 [employer].  

## 2013-04-15 NOTE — Progress Notes (Signed)
Pt has chiropractor scheduled with [facility] @ [phone] for October 20th @ 1515pm F/U US scheduled for October 16th @ 0730

## 2013-04-15 NOTE — Progress Notes (Signed)
P=88 Pt. Requests more supplies for finger sticks.  Husband present as interpreter.

## 2013-04-15 NOTE — Progress Notes (Signed)
Asthma attack yesterday, better after neb tx and decadron burst. Needs f/u US in [facility] this week, will need fetal echo. FBS 96-135, pp mostly <139 but few up to 201-230. Discuss with DM educator, RTC, consider insulin if not improved

## 2013-04-15 NOTE — Progress Notes (Signed)
DIABETES:  Laurie Hess comes with her husband as interpreter for GDM f/u. Her glucose readings FBS 96-130 mg/dl, 2hpp 811-914 mg/dl. After review of chart it is noted that on 04/08/13 RX for glyburide and glucophage were both ordered. However the glucophage did not get transmitted to the pharmacy. This was corrected by Dr. Macon Large. Patient will begin glucophage 500mg  BID with food this evening. We will review glucose readings next week and consider initiation of insulin. Patient also experience asthma attack warranting steroid therapy.

## 2013-04-16 ENCOUNTER — Other Ambulatory Visit: Payer: Self-pay | Admitting: Obstetrics & Gynecology

## 2013-04-16 DIAGNOSIS — O24912 Unspecified diabetes mellitus in pregnancy, second trimester: Secondary | ICD-10-CM

## 2013-04-17 ENCOUNTER — Encounter: Payer: Self-pay | Admitting: Obstetrics and Gynecology

## 2013-04-17 ENCOUNTER — Encounter: Payer: Medicaid Other | Admitting: Advanced Practice Midwife

## 2013-04-17 ENCOUNTER — Telehealth: Payer: Self-pay

## 2013-04-17 NOTE — Telephone Encounter (Signed)
Aviva Signs, CNM [facility]            Per note from [place], needs to see MFM for genetic counseling.

## 2013-04-17 NOTE — Telephone Encounter (Signed)
Scheduled genetic counseling for 04/18/13 @ 0900.   Called pt and left message that nevertheless does she have an [place] appt 04/18/13 @ 0730 but she also has a MFM appt atr 0900 and to please give [place] a call indicate that you got the message for appt. I also called [place] and spoke with Candise Che to please inform or make note that when pt comes in for [place] appt to please inform her of the follow MFM appt.  Kale "ok".

## 2013-04-18 ENCOUNTER — Ambulatory Visit (HOSPITAL_COMMUNITY)
Admission: RE | Admit: 2013-04-18 | Discharge: 2013-04-18 | Disposition: A | Discharge status: Home / Self Care | Payer: Medicaid Other | Source: Ambulatory Visit | Attending: Obstetrics & Gynecology | Admitting: Obstetrics & Gynecology

## 2013-04-18 ENCOUNTER — Ambulatory Visit (HOSPITAL_COMMUNITY): Admission: RE | Admit: 2013-04-18 | Payer: Medicaid Other | Source: Ambulatory Visit

## 2013-04-18 ENCOUNTER — Ambulatory Visit (HOSPITAL_COMMUNITY)
Admission: RE | Admit: 2013-04-18 | Discharge: 2013-04-18 | Disposition: A | Discharge status: Home / Self Care | Payer: Medicaid Other | Source: Ambulatory Visit | Attending: Obstetrics and Gynecology | Admitting: Obstetrics and Gynecology

## 2013-04-18 DIAGNOSIS — O358XX0 Maternal care for other (suspected) fetal abnormality and damage, not applicable or unspecified: Secondary | ICD-10-CM | POA: Insufficient documentation

## 2013-04-18 DIAGNOSIS — Z3689 Encounter for other specified antenatal screening: Secondary | ICD-10-CM | POA: Insufficient documentation

## 2013-04-18 DIAGNOSIS — O24919 Unspecified diabetes mellitus in pregnancy, unspecified trimester: Secondary | ICD-10-CM | POA: Insufficient documentation

## 2013-04-18 DIAGNOSIS — O24912 Unspecified diabetes mellitus in pregnancy, second trimester: Secondary | ICD-10-CM

## 2013-04-19 NOTE — Progress Notes (Signed)
Genetic Counseling  High-Risk Gestation Note  Appointment Date:  04/18/2013 Referred By: Danae Orleans, CNM Date of Birth:  [birth year] Partner:  Myrtis Hopping    Pregnancy History: [id number] Estimated Date of Delivery: 08/31/13 Estimated Gestational Age: [redacted]w[redacted]d Attending: Particia Nearing, MD   Mrs. Laurie Hess and her husband, Mr. Odessa Nishi, were seen for genetic counseling regarding the previous ultrasound finding of fetal pyelectasis. The patient's husband assisted with interpretation during parts of today's session. The patient and her husband declined the use of medical interpreter.   We began by reviewing the ultrasound results in detail. Ultrasound performed at [facility] on 04/08/13 visualized bilateral pyelectasis: right measurement 6 mm and left measurement 5 mm. Follow-up ultrasound was performed in [facility] this morning and visualized the grade 2 hydronephrosis on the right kidney (9.2 mm) and pyelectasis on the left kidney (6.5 mm).     We met to discuss the ultrasound finding of pyelectasis.  We discussed that fetal hydronephrosis or pyelectasis is defined as the dilatation of the fetal renal pelvis/pelvises.  We discussed that there is some overlap between the normal and abnormal pelvic anteroposterior (AP) diameters, lending to variation in cut-off values.  However, most sources agree that >4 mm AP diameter is considered abnormal at [redacted] weeks gestation.  Ms. Kimie Pidcock was counseled that this finding is very commonly observed by prenatal ultrasound and is estimated to occur in 2-3% of fetuses.  The female to female ratio is 2:1.  We discussed that hydronephrosis may be due to obstruction along the urinary tract, causing an excess collection of fluid in the renal pelvis.  The differential diagnoses can include UPJ obstruction, vesicoureteral reflux, UVJ obstruction, PUV, and multi or polycystic kidneys.  She was counseled that a definitive etiology may not  be determined until after delivery.  They were counseled that hydronephrosis is most often considered multifactorial in etiology, but dominant inheritance has been observed in some families.    We discussed that the finding of hydronephrosis/pyelectasis is associated with an increased risk for fetal aneuploidy.  This risk is highest when other anomalies or fetal differences are visualized.  In the case of isolated hydronephrosis/pyelectasis, the risk of aneuploidy is increased approximately 2 fold. We reviewed chromosomes, nondisjunction, and examples of chromosome conditions including Down syndrome (trisomy 21).  Ms. Noam Franzen screen was screen negative for Down syndrome (1 in 27,400) and trisomy 26 (1 in 46,300), thus the risk of aneuploidy is estimated to be less than the risk of amniocentesis.  We reviewed amniocentesis including the benefits, risks, and limitations. We also discussed the additional screening option of noninvasive prenatal screening (NIPS)/cell free DNA testing including the conditions for which it screens, the detection rates, and false positive rates. The couple declined NIPS and amniocentesis at this time.  They understand that ultrasound and Quad screen cannot diagnose or rule out all birth defects or genetic conditions in pregnancy.   Ms. Noretta Frier was also counseled regarding prenatal and postnatal management of hydronephrosis.  We discussed the options of serial ultrasounds (every 4 weeks) and consultation with a pediatric urologist, which they expressed interest in at a later date.  We discussed that hydronephrosis may require neonatal surgical intervention. Follow-up ultrasound was scheduled for 05/27/13 at [facility] office.   Both family histories were reviewed and found to be noncontributory for birth defects, mental retardation, and known genetic conditions. The patient had limited information regarding her mother's health history and her  maternal extended family history given that her mother passed away when the patient was a young age. We, thus, cannot comment on how this family history may contribute to the risk for a birth defect or genetic condition for the pregnancy. Without further information regarding the provided family history, an accurate genetic risk cannot be calculated. Further genetic counseling is warranted if more information is obtained.  Mrs. Crickenberger denied exposure to environmental toxins or chemical agents. She denied the use of alcohol, tobacco or street drugs. She denied significant viral illnesses during the course of her pregnancy. Her medical and surgical histories were contributory for gestational diabetes, which she reported is currently treated with medication.    I counseled this couple regarding the above risks and available options.  The approximate face-to-face time with the genetic counselor was 35 minutes.  Quinn Plowman, MS Certified Genetic Counselor 04/19/2013

## 2013-04-22 ENCOUNTER — Encounter: Payer: Self-pay | Admitting: Family Medicine

## 2013-04-22 ENCOUNTER — Ambulatory Visit (INDEPENDENT_AMBULATORY_CARE_PROVIDER_SITE_OTHER): Payer: Medicaid Other | Admitting: Family Medicine

## 2013-04-22 VITALS — BP 117/72 | Temp 96.7°F | Wt 180.2 lb

## 2013-04-22 DIAGNOSIS — E669 Obesity, unspecified: Secondary | ICD-10-CM

## 2013-04-22 LAB — POCT URINALYSIS DIP (DEVICE)
Glucose, UA: 500 mg/dL — AB
Hgb urine dipstick: NEGATIVE
Ketones, ur: 40 mg/dL — AB
Nitrite: NEGATIVE
Specific Gravity, Urine: >=1.03 (ref 1.005–1.030)

## 2013-04-22 NOTE — Progress Notes (Signed)
Nutrition note: f/u re: GDM diet Pt has gained 5.2# @ [redacted]w[redacted]d, which is wnl. Pt reports eating 3 meals & 2 snacks/d. BS: fasting: 60-100, 2hr pp: 75-192 Pt is taking PNV. Pt reports no N/V or heartburn. Pt reports walking 5 min ~2x/wk. Reviewed GDM diet and discussed portion sizes for rice and tortillas. Also encouraged pt to try brown rice mixed with her white rice, add a protein source with her fruit snacks, and add ~10 mins of walking after every meal. Pt agrees to follow suggestions and continue checking her BS. F/u in 4-6 wks Blondell Reveal, MS, RD, LDN

## 2013-04-22 NOTE — Progress Notes (Signed)
FBS 60-104 2 are out of range 2 hr pp 75-192 11 of 21 out of range Dietary problems related to rice--need to stop eating so much rice

## 2013-04-22 NOTE — Progress Notes (Signed)
Pulse: 91

## 2013-04-22 NOTE — Patient Instructions (Signed)
Pregnancy - Second Trimester The second trimester of pregnancy (3 to 6 months) is a period of rapid growth for you and your baby. At the end of the sixth month, your baby is about 9 inches long and weighs 1 1/2 pounds. You will begin to feel the baby move between 18 and 20 weeks of the pregnancy. This is called quickening. Weight gain is faster. A clear fluid (colostrum) may leak out of your breasts. You may feel small contractions of the womb (uterus). This is known as false labor or Braxton-Hicks contractions. This is like a practice for labor when the baby is ready to be born. Usually, the problems with morning sickness have usually passed by the end of your first trimester. Some women develop small dark blotches (called cholasma, mask of pregnancy) on their face that usually goes away after the baby is born. Exposure to the sun makes the blotches worse. Acne may also develop in some pregnant women and pregnant women who have acne, may find that it goes away. PRENATAL EXAMS  Blood work may continue to be done during prenatal exams. These tests are done to check on your health and the probable health of your baby. Blood work is used to follow your blood levels (hemoglobin). Anemia (low hemoglobin) is common during pregnancy. Iron and vitamins are given to help prevent this. You will also be checked for diabetes between 24 and 28 weeks of the pregnancy. Some of the previous blood tests may be repeated.  The size of the uterus is measured during each visit. This is to make sure that the baby is continuing to grow properly according to the dates of the pregnancy.  Your blood pressure is checked every prenatal visit. This is to make sure you are not getting toxemia.  Your urine is checked to make sure you do not have an infection, diabetes or protein in the urine.  Your weight is checked often to make sure gains are happening at the suggested rate. This is to ensure that both you and your baby are  growing normally.  Sometimes, an ultrasound is performed to confirm the proper growth and development of the baby. This is a test which bounces harmless sound waves off the baby so your caregiver can more accurately determine due dates. Sometimes, a test is done on the amniotic fluid surrounding the baby. This test is called an amniocentesis. The amniotic fluid is obtained by sticking a needle into the belly (abdomen). This is done to check the chromosomes in instances where there is a concern about possible genetic problems with the baby. It is also sometimes done near the end of pregnancy if an early delivery is required. In this case, it is done to help make sure the baby's lungs are mature enough for the baby to live outside of the womb. CHANGES OCCURING IN THE SECOND TRIMESTER OF PREGNANCY Your body goes through many changes during pregnancy. They vary from person to person. Talk to your caregiver about changes you notice that you are concerned about.  During the second trimester, you will likely have an increase in your appetite. It is normal to have cravings for certain foods. This varies from person to person and pregnancy to pregnancy.  Your lower abdomen will begin to bulge.  You may have to urinate more often because the uterus and baby are pressing on your bladder. It is also common to get more bladder infections during pregnancy. You can help this by drinking lots of fluids   and emptying your bladder before and after intercourse.  You may begin to get stretch marks on your hips, abdomen, and breasts. These are normal changes in the body during pregnancy. There are no exercises or medicines to take that prevent this change.  You may begin to develop swollen and bulging veins (varicose veins) in your legs. Wearing support hose, elevating your feet for 15 minutes, 3 to 4 times a day and limiting salt in your diet helps lessen the problem.  Heartburn may develop as the uterus grows and  pushes up against the stomach. Antacids recommended by your caregiver helps with this problem. Also, eating smaller meals 4 to 5 times a day helps.  Constipation can be treated with a stool softener or adding bulk to your diet. Drinking lots of fluids, and eating vegetables, fruits, and whole grains are helpful.  Exercising is also helpful. If you have been very active up until your pregnancy, most of these activities can be continued during your pregnancy. If you have been less active, it is helpful to start an exercise program such as walking.  Hemorrhoids may develop at the end of the second trimester. Warm sitz baths and hemorrhoid cream recommended by your caregiver helps hemorrhoid problems.  Backaches may develop during this time of your pregnancy. Avoid heavy lifting, wear low heal shoes, and practice good posture to help with backache problems.  Some pregnant women develop tingling and numbness of their hand and fingers because of swelling and tightening of ligaments in the wrist (carpel tunnel syndrome). This goes away after the baby is born.  As your breasts enlarge, you may have to get a bigger bra. Get a comfortable, cotton, support bra. Do not get a nursing bra until the last month of the pregnancy if you will be nursing the baby.  You may get a dark line from your belly button to the pubic area called the linea nigra.  You may develop rosy cheeks because of increase blood flow to the face.  You may develop spider looking lines of the face, neck, arms, and chest. These go away after the baby is born. HOME CARE INSTRUCTIONS   It is extremely important to avoid all smoking, herbs, alcohol, and unprescribed drugs during your pregnancy. These chemicals affect the formation and growth of the baby. Avoid these chemicals throughout the pregnancy to ensure the delivery of a healthy infant.  Most of your home care instructions are the same as suggested for the first trimester of your  pregnancy. Keep your caregiver's appointments. Follow your caregiver's instructions regarding medicine use, exercise, and diet.  During pregnancy, you are providing food for you and your baby. Continue to eat regular, well-balanced meals. Choose foods such as meat, fish, milk and other low fat dairy products, vegetables, fruits, and whole-grain breads and cereals. Your caregiver will tell you of the ideal weight gain.  A physical sexual relationship may be continued up until near the end of pregnancy if there are no other problems. Problems could include early (premature) leaking of amniotic fluid from the membranes, vaginal bleeding, abdominal pain, or other medical or pregnancy problems.  Exercise regularly if there are no restrictions. Check with your caregiver if you are unsure of the safety of some of your exercises. The greatest weight gain will occur in the last 2 trimesters of pregnancy. Exercise will help you:  Control your weight.  Get you in shape for labor and delivery.  Lose weight after you have the baby.  Wear   a good support or jogging bra for breast tenderness during pregnancy. This may help if worn during sleep. Pads or tissues may be used in the bra if you are leaking colostrum.  Do not use hot tubs, steam rooms or saunas throughout the pregnancy.  Wear your seat belt at all times when driving. This protects you and your baby if you are in an accident.  Avoid raw meat, uncooked cheese, cat litter boxes, and soil used by cats. These carry germs that can cause birth defects in the baby.  The second trimester is also a good time to visit your dentist for your dental health if this has not been done yet. Getting your teeth cleaned is okay. Use a soft toothbrush. Brush gently during pregnancy.  It is easier to leak urine during pregnancy. Tightening up and strengthening the pelvic muscles will help with this problem. Practice stopping your urination while you are going to the  bathroom. These are the same muscles you need to strengthen. It is also the muscles you would use as if you were trying to stop from passing gas. You can practice tightening these muscles up 10 times a set and repeating this about 3 times per day. Once you know what muscles to tighten up, do not perform these exercises during urination. It is more likely to contribute to an infection by backing up the urine.  Ask for help if you have financial, counseling, or nutritional needs during pregnancy. Your caregiver will be able to offer counseling for these needs as well as refer you for other special needs.  Your skin may become oily. If so, wash your face with mild soap, use non-greasy moisturizer and oil or cream based makeup. MEDICINES AND DRUG USE IN PREGNANCY  Take prenatal vitamins as directed. The vitamin should contain 1 milligram of folic acid. Keep all vitamins out of reach of children. Only a couple vitamins or tablets containing iron may be fatal to a baby or young child when ingested.  Avoid use of all medicines, including herbs, over-the-counter medicines, not prescribed or suggested by your caregiver. Only take over-the-counter or prescription medicines for pain, discomfort, or fever as directed by your caregiver. Do not use aspirin.  Let your caregiver also know about herbs you may be using.  Alcohol is related to a number of birth defects. This includes fetal alcohol syndrome. All alcohol, in any form, should be avoided completely. Smoking will cause low birth rate and premature babies.  Street or illegal drugs are very harmful to the baby. They are absolutely forbidden. A baby born to an addicted mother will be addicted at birth. The baby will go through the same withdrawal an adult does. SEEK MEDICAL CARE IF:  You have any concerns or worries during your pregnancy. It is better to call with your questions if you feel they cannot wait, rather than worry about them. SEEK IMMEDIATE  MEDICAL CARE IF:   An unexplained oral temperature above 102 F (38.9 C) develops, or as your caregiver suggests.  You have leaking of fluid from the vagina (birth canal). If leaking membranes are suspected, take your temperature and tell your caregiver of this when you call.  There is vaginal spotting, bleeding, or passing clots. Tell your caregiver of the amount and how many pads are used. Light spotting in pregnancy is common, especially following intercourse.  You develop a bad smelling vaginal discharge with a change in the color from clear to white.  You continue to feel   sick to your stomach (nauseated) and have no relief from remedies suggested. You vomit blood or coffee ground-like materials.  You lose more than 2 pounds of weight or gain more than 2 pounds of weight over 1 week, or as suggested by your caregiver.  You notice swelling of your face, hands, feet, or legs.  You get exposed to German measles and have never had them.  You are exposed to fifth disease or chickenpox.  You develop belly (abdominal) pain. Round ligament discomfort is a common non-cancerous (benign) cause of abdominal pain in pregnancy. Your caregiver still must evaluate you.  You develop a bad headache that does not go away.  You develop fever, diarrhea, pain with urination, or shortness of breath.  You develop visual problems, blurry, or double vision.  You fall or are in a car accident or any kind of trauma.  There is mental or physical violence at home. Document Released: 06/14/2001 Document Revised: 03/14/2012 Document Reviewed: 12/17/2008 [employer] Patient Information 2014 [employer].  Breastfeeding A change in hormones during your pregnancy causes growth of your breast tissue and an increase in number and size of milk ducts. The hormone prolactin allows proteins, sugars, and fats from your blood supply to make breast milk in your milk-producing glands. The hormone progesterone prevents  breast milk from being released before the birth of your baby. After the birth of your baby, your progesterone level decreases allowing breast milk to be released. Thoughts of your baby, as well as his or her sucking or crying, can stimulate the release of milk from the milk-producing glands. Deciding to breastfeed (nurse) is one of the best choices you can make for you and your baby. The information that follows gives a brief review of the benefits, as well as other important skills to know about breastfeeding. BENEFITS OF BREASTFEEDING For your baby  The first milk (colostrum) helps your baby's digestive system function better.   There are antibodies in your milk that help your baby fight off infections.   Your baby has a lower incidence of asthma, allergies, and sudden infant death syndrome (SIDS).   The nutrients in breast milk are better for your baby than infant formulas.  Breast milk improves your baby's brain development.   Your baby will have less gas, colic, and constipation.  Your baby is less likely to develop other conditions, such as childhood obesity, asthma, or diabetes mellitus. For you  Breastfeeding helps develop a very special bond between you and your baby.   Breastfeeding is convenient, always available at the correct temperature, and costs nothing.   Breastfeeding helps to burn calories and helps you lose the weight gained during pregnancy.   Breastfeeding makes your uterus contract back down to normal size faster and slows bleeding following delivery.   Breastfeeding mothers have a lower risk of developing osteoporosis or breast or ovarian cancer later in life.  BREASTFEEDING FREQUENCY  A healthy, full-term baby may breastfeed as often as every hour or space his or her feedings to every 3 hours. Breastfeeding frequency will vary from baby to baby.   Newborns should be fed no less than every 2 3 hours during the day and every 4 5 hours during the  night. You should breastfeed a minimum of 8 feedings in a 24 hour period.  Awaken your baby to breastfeed if it has been 3 4 hours since the last feeding.  Breastfeed when you feel the need to reduce the fullness of your breasts or when   your newborn shows signs of hunger. Signs that your baby may be hungry include:  Increased alertness or activity.  Stretching.  Movement of the head from side to side.  Movement of the head and opening of the mouth when the corner of the mouth or cheek is stroked (rooting).  Increased sucking sounds, smacking lips, cooing, sighing, or squeaking.  Hand-to-mouth movements.  Increased sucking of fingers or hands.  Fussing.  Intermittent crying.  Signs of extreme hunger will require calming and consoling before you try to feed your baby. Signs of extreme hunger may include:  Restlessness.  A loud, strong cry.  Screaming.  Frequent feeding will help you make more milk and will help prevent problems, such as sore nipples and engorgement of the breasts.  BREASTFEEDING   Whether lying down or sitting, be sure that the baby's abdomen is facing your abdomen.   Support your breast with 4 fingers under your breast and your thumb above your nipple. Make sure your fingers are well away from your nipple and your baby's mouth.   Stroke your baby's lips gently with your finger or nipple.   When your baby's mouth is open wide enough, place all of your nipple and as much of the colored area around your nipple (areola) as possible into your baby's mouth.  More areola should be visible above his or her upper lip than below his or her lower lip.  Your baby's tongue should be between his or her lower gum and your breast.  Ensure that your baby's mouth is correctly positioned around the nipple (latched). Your baby's lips should create a seal on your breast.  Signs that your baby has effectively latched onto your nipple include:  Tugging or sucking  without pain.  Swallowing heard between sucks.  Absent click or smacking sound.  Muscle movement above and in front of his or her ears with sucking.  Your baby must suck about 2 3 minutes in order to get your milk. Allow your baby to feed on each breast as long as he or she wants. Nurse your baby until he or she unlatches or falls asleep at the first breast, then offer the second breast.  Signs that your baby is full and satisfied include:  A gradual decrease in the number of sucks or complete cessation of sucking.  Falling asleep.  Extension or relaxation of his or her body.  Retention of a small amount of milk in his or her mouth.  Letting go of your breast by himself or herself.  Signs of effective breastfeeding in you include:  Breasts that have increased firmness, weight, and size prior to feeding.  Breasts that are softer after nursing.  Increased milk volume, as well as a change in milk consistency and color by the 5th day of breastfeeding.  Breast fullness relieved by breastfeeding.  Nipples are not sore, cracked, or bleeding.  If needed, break the suction by putting your finger into the corner of your baby's mouth and sliding your finger between his or her gums. Then, remove your breast from his or her mouth.  It is common for babies to spit up a small amount after a feeding.  Babies often swallow air during feeding. This can make babies fussy. Burping your baby between breasts can help with this.  Vitamin D supplements are recommended for babies who get only breast milk.  Avoid using a pacifier during your baby's first 4 6 weeks.  Avoid supplemental feedings of water, formula, or   juice in place of breastfeeding. Breast milk is all the food your baby needs. It is not necessary for your baby to have water or formula. Your breasts will make more milk if supplemental feedings are avoided during the early weeks. HOW TO TELL WHETHER YOUR BABY IS GETTING ENOUGH BREAST  MILK Wondering whether or not your baby is getting enough milk is a common concern among mothers. You can be assured that your baby is getting enough milk if:   Your baby is actively sucking and you hear swallowing.   Your baby seems relaxed and satisfied after a feeding.   Your baby nurses at least 8 12 times in a 24 hour time period.  During the first 3 5 days of age:  Your baby is wetting at least 3 5 diapers in a 24 hour period. The urine should be clear and pale yellow.  Your baby is having at least 3 4 stools in a 24 hour period. The stool should be soft and yellow.  At 5 7 days of age, your baby is having at least 3 6 stools in a 24 hour period. The stool should be seedy and yellow by 5 days of age.  Your baby has a weight loss less than 7 10% during the first 3 days of age.  Your baby does not lose weight after 3 7 days of age.  Your baby gains 4 7 ounces each week after he or she is 4 days of age.  Your baby gains weight by 5 days of age and is back to birth weight within 2 weeks. ENGORGEMENT In the first week after your baby is born, you may experience extremely full breasts (engorgement). When engorged, your breasts may feel heavy, warm, or tender to the touch. Engorgement peaks within 24 48 hours after delivery of your baby.  Engorgement may be reduced by:  Continuing to breastfeed.  Increasing the frequency of breastfeeding.  Taking warm showers or applying warm, moist heat to your breasts just before each feeding. This increases circulation and helps the milk flow.   Gently massaging your breast before and during the feedings. With your fingertips, massage from your chest wall towards your nipple in a circular motion.   Ensuring that your baby empties at least one breast at every feeding. It also helps to start the next feeding on the opposite breast.   Expressing breast milk by hand or by using a breast pump to empty the breasts if your baby is sleepy, or  not nursing well. You may also want to express milk if you are returning to work oryou feel you are getting engorged.  Ensuring your baby is latched on and positioned properly while breastfeeding. If you follow these suggestions, your engorgement should improve in 24 48 hours. If you are still experiencing difficulty, call your lactation consultant or caregiver.  CARING FOR YOURSELF Take care of your breasts.  Bathe or shower daily.   Avoid using soap on your nipples.   Wear a supportive bra. Avoid wearing underwire style bras.  Air dry your nipples for a 3 4minutes after each feeding.   Use only cotton bra pads to absorb breast milk leakage. Leaking of breast milk between feedings is normal.   Use only pure lanolin on your nipples after nursing. You do not need to wash it off before feeding your baby again. Another option is to express a few drops of breast milk and gently massage that milk into your nipples.  Continue   breast self-awareness checks. Take care of yourself.  Eat healthy foods. Alternate 3 meals with 3 snacks.  Avoid foods that you notice affect your baby in a bad way.  Drink milk, fruit juice, and water to satisfy your thirst (about 8 glasses a day).   Rest often, relax, and take your prenatal vitamins to prevent fatigue, stress, and anemia.  Avoid chewing and smoking tobacco.  Avoid alcohol and drug use.  Take over-the-counter and prescribed medicine only as directed by your caregiver or pharmacist. You should always check with your caregiver or pharmacist before taking any new medicine, vitamin, or herbal supplement.  Know that pregnancy is possible while breastfeeding. If desired, talk to your caregiver about family planning and safe birth control methods that may be used while breastfeeding. SEEK MEDICAL CARE IF:   You feel like you want to stop breastfeeding or have become frustrated with breastfeeding.  You have painful breasts or nipples.  Your  nipples are cracked or bleeding.  Your breasts are red, tender, or warm.  You have a swollen area on either breast.  You have a fever or chills.  You have nausea or vomiting.  You have drainage from your nipples.  Your breasts do not become full before feedings by the 5th day after delivery.  You feel sad and depressed.  Your baby is too sleepy to eat well.  Your baby is having trouble sleeping.   Your baby is wetting less than 3 diapers in a 24 hour period.  Your baby has less than 3 stools in a 24 hour period.  Your baby's skin or the white part of his or her eyes becomes more yellow.   Your baby is not gaining weight by 5 days of age. MAKE SURE YOU:   Understand these instructions.  Will watch your condition.  Will get help right away if you are not doing well or get worse. Document Released: 06/20/2005 Document Revised: 03/14/2012 Document Reviewed: 01/25/2012 [employer] Patient Information 2014 [employer].  

## 2013-04-22 NOTE — Progress Notes (Signed)
Fetal echo was already scheduled with Dr. Casilda Carls office for 05/06/13 at 9 am. Retinal scan scheduled with [facility] on 04/30/13 at 3:30 pm.

## 2013-04-29 ENCOUNTER — Ambulatory Visit (INDEPENDENT_AMBULATORY_CARE_PROVIDER_SITE_OTHER): Payer: Medicaid Other | Admitting: Obstetrics and Gynecology

## 2013-04-29 ENCOUNTER — Encounter: Payer: Self-pay | Admitting: Obstetrics and Gynecology

## 2013-04-29 ENCOUNTER — Encounter: Payer: Medicaid Other | Admitting: *Deleted

## 2013-04-29 VITALS — BP 102/64 | Temp 97.0°F | Wt 184.0 lb

## 2013-04-29 DIAGNOSIS — O24912 Unspecified diabetes mellitus in pregnancy, second trimester: Secondary | ICD-10-CM

## 2013-04-29 DIAGNOSIS — D696 Thrombocytopenia, unspecified: Secondary | ICD-10-CM

## 2013-04-29 DIAGNOSIS — O24919 Unspecified diabetes mellitus in pregnancy, unspecified trimester: Secondary | ICD-10-CM

## 2013-04-29 DIAGNOSIS — D689 Coagulation defect, unspecified: Secondary | ICD-10-CM

## 2013-04-29 LAB — POCT URINALYSIS DIP (DEVICE)
Bilirubin Urine: NEGATIVE
Glucose, UA: NEGATIVE mg/dL
Hgb urine dipstick: NEGATIVE
Ketones, ur: 40 mg/dL — AB
Specific Gravity, Urine: 1.02 (ref 1.005–1.030)
pH: 6 (ref 5.0–8.0)

## 2013-04-29 MED ORDER — INSULIN NPH (HUMAN) (ISOPHANE) 100 UNIT/ML ~~LOC~~ SUSP: SUBCUTANEOUS | Status: DC

## 2013-04-29 MED ORDER — INSULIN REGULAR HUMAN 100 UNIT/ML IJ SOLN: INTRAMUSCULAR | Status: DC

## 2013-04-29 NOTE — Progress Notes (Signed)
P=[id number] 

## 2013-04-29 NOTE — Addendum Note (Signed)
Addended by: Caren Griffins C on: 04/29/2013 10:07 AM   Modules accepted: Orders

## 2013-04-29 NOTE — Progress Notes (Signed)
Reviewed baseline labs for A2/B DM> all ok except A1C6.5 and borderline thrombocytopenia. Has fetal echo scheduled.  On diet and oral agent over a week. CBGs all high on glyburide 5 mg bid. [id number], PPs 120's-207> see DM educator and adjust meds/start insulin.

## 2013-04-29 NOTE — Patient Instructions (Signed)

## 2013-04-29 NOTE — Progress Notes (Signed)
DIABETES: Pt presents due to elevated glucose readings both FBS and and 2hpp. Initiation of insulin has been recommended. Calculated dose is: NPH 30units in AM and NPH11units at bedtime. Reg 15 units in AM and Reg 11 units with dinner. However, when insulin is initiated I would recommend initial dosing to be decreased by 1/3 for first week. Then reevaluate for increase.  Patient is tearful and is refusing to start insulin today. She wants to increase PO medications and modify diet for one week more. After consultation with Sharolyn Douglas NP. We will Increase Glyburide to 7.5mg  BID and Metformin 1000mg  BID. We discussed in detail the carbohydrate restrictions.

## 2013-04-29 NOTE — Progress Notes (Signed)
Note: also on Metformin 500 bid.

## 2013-04-30 ENCOUNTER — Encounter: Payer: Self-pay | Admitting: Home Health Services

## 2013-04-30 ENCOUNTER — Ambulatory Visit (INDEPENDENT_AMBULATORY_CARE_PROVIDER_SITE_OTHER): Payer: Medicaid Other | Admitting: Home Health Services

## 2013-04-30 DIAGNOSIS — O24912 Unspecified diabetes mellitus in pregnancy, second trimester: Secondary | ICD-10-CM

## 2013-04-30 DIAGNOSIS — O24919 Unspecified diabetes mellitus in pregnancy, unspecified trimester: Secondary | ICD-10-CM

## 2013-04-30 NOTE — Progress Notes (Signed)
DIABETES Pt came in to have a retinal scan per diabetic care.   Image was taken and submitted to [facility]-DR. Garg for reading.    Results will be available in 1-2 weeks.  Results will be given to PCP for review and to contact patient.  Laurie Hess  

## 2013-05-06 ENCOUNTER — Ambulatory Visit (INDEPENDENT_AMBULATORY_CARE_PROVIDER_SITE_OTHER): Payer: Medicaid Other | Admitting: Obstetrics & Gynecology

## 2013-05-06 ENCOUNTER — Encounter: Payer: Self-pay | Admitting: Obstetrics & Gynecology

## 2013-05-06 VITALS — BP 102/65 | Temp 97.3°F | Wt 182.5 lb

## 2013-05-06 DIAGNOSIS — O24919 Unspecified diabetes mellitus in pregnancy, unspecified trimester: Secondary | ICD-10-CM

## 2013-05-06 DIAGNOSIS — O24912 Unspecified diabetes mellitus in pregnancy, second trimester: Secondary | ICD-10-CM

## 2013-05-06 LAB — POCT URINALYSIS DIP (DEVICE)
Bilirubin Urine: NEGATIVE
Glucose, UA: 250 mg/dL — AB
Hgb urine dipstick: NEGATIVE
Nitrite: NEGATIVE
pH: 6 (ref 5.0–8.0)

## 2013-05-06 LAB — GLUCOSE, CAPILLARY: Glucose-Capillary: 199 mg/dL — ABNORMAL HIGH (ref 70–99)

## 2013-05-06 MED ORDER — GLYBURIDE 5 MG PO TABS: ORAL_TABLET | ORAL | Status: DC

## 2013-05-06 NOTE — Progress Notes (Signed)
Has [place] scheduled for 05/27/13;  Recorded in log book--Fasting:  109,70,85,90,88,99,65  2 hr pp breakfast  120,100,116,120,117  2 hr pp lunch 91,115,115,67,125,71  2 hr pp dinner  115,118,129080,75,102.  Pt well controlled on metformin and glyburide.  CBG today 2 hr 15 min pp = 199  Asked to review pts meter--she is recording wrong numbers in her cbg log (question whether is writing down wrong numbers so she will not have to start insulin).  Pts fastings are mostly in 90s-116.  Will increase glyburide at night to 7.5 mg.  Stressed importance of writing accurate numbers in log so that I can best help her.  Hyperglycemia can lead to fetal demise.    ALWAYS REVIEW PTS METER

## 2013-05-06 NOTE — Progress Notes (Signed)
P = 94 

## 2013-05-06 NOTE — Patient Instructions (Signed)
Contraception Choices  Contraception (birth control) is the use of any methods or devices to prevent pregnancy. Below are some methods to help avoid pregnancy.  HORMONAL METHODS   · Contraceptive implant. This is a thin, plastic tube containing progesterone hormone. It does not contain estrogen hormone. Your caregiver inserts the tube in the inner part of the upper arm. The tube can remain in place for up to 3 years. After 3 years, the implant must be removed. The implant prevents the ovaries from releasing an egg (ovulation), thickens the cervical mucus which prevents sperm from entering the uterus, and thins the lining of the inside of the uterus.  · Progesterone-only injections. These injections are given every 3 months by your caregiver to prevent pregnancy. This synthetic progesterone hormone stops the ovaries from releasing eggs. It also thickens cervical mucus and changes the uterine lining. This makes it harder for sperm to survive in the uterus.  · Birth control pills. These pills contain estrogen and progesterone hormone. They work by stopping the egg from forming in the ovary (ovulation). Birth control pills are prescribed by a caregiver. Birth control pills can also be used to treat heavy periods.  · Minipill. This type of birth control pill contains only the progesterone hormone. They are taken every day of each month and must be prescribed by your caregiver.  · Birth control patch. The patch contains hormones similar to those in birth control pills. It must be changed once a week and is prescribed by a caregiver.  · Vaginal ring. The ring contains hormones similar to those in birth control pills. It is left in the vagina for 3 weeks, removed for 1 week, and then a new one is put back in place. The patient must be comfortable inserting and removing the ring from the vagina. A caregiver's prescription is necessary.  · Emergency contraception. Emergency contraceptives prevent pregnancy after unprotected  sexual intercourse. This pill can be taken right after sex or up to 5 days after unprotected sex. It is most effective the sooner you take the pills after having sexual intercourse. Emergency contraceptive pills are available without a prescription. Check with your pharmacist. Do not use emergency contraception as your only form of birth control.  BARRIER METHODS   · Female condom. This is a thin sheath (latex or rubber) that is worn over the penis during sexual intercourse. It can be used with spermicide to increase effectiveness.  · Female condom. This is a soft, loose-fitting sheath that is put into the vagina before sexual intercourse.  · Diaphragm. This is a soft, latex, dome-shaped barrier that must be fitted by a caregiver. It is inserted into the vagina, along with a spermicidal jelly. It is inserted before intercourse. The diaphragm should be left in the vagina for 6 to 8 hours after intercourse.  · Cervical cap. This is a round, soft, latex or plastic cup that fits over the cervix and must be fitted by a caregiver. The cap can be left in place for up to 48 hours after intercourse.  · Sponge. This is a soft, circular piece of polyurethane foam. The sponge has spermicide in it. It is inserted into the vagina after wetting it and before sexual intercourse.  · Spermicides. These are chemicals that kill or block sperm from entering the cervix and uterus. They come in the form of creams, jellies, suppositories, foam, or tablets. They do not require a prescription. They are inserted into the vagina with an applicator before having sexual intercourse.   The process must be repeated every time you have sexual intercourse.  INTRAUTERINE CONTRACEPTION  · Intrauterine device (IUD). This is a T-shaped device that is put in a woman's uterus during a menstrual period to prevent pregnancy. There are 2 types:  · Copper IUD. This type of IUD is wrapped in copper wire and is placed inside the uterus. Copper makes the uterus and  fallopian tubes produce a fluid that kills sperm. It can stay in place for 10 years.  · Hormone IUD. This type of IUD contains the hormone progestin (synthetic progesterone). The hormone thickens the cervical mucus and prevents sperm from entering the uterus, and it also thins the uterine lining to prevent implantation of a fertilized egg. The hormone can weaken or kill the sperm that get into the uterus. It can stay in place for 5 years.  PERMANENT METHODS OF CONTRACEPTION  · Female tubal ligation. This is when the woman's fallopian tubes are surgically sealed, tied, or blocked to prevent the egg from traveling to the uterus.  · Female sterilization. This is when the female has the tubes that carry sperm tied off (vasectomy). This blocks sperm from entering the vagina during sexual intercourse. After the procedure, the man can still ejaculate fluid (semen).  NATURAL PLANNING METHODS  · Natural family planning. This is not having sexual intercourse or using a barrier method (condom, diaphragm, cervical cap) on days the woman could become pregnant.  · Calendar method. This is keeping track of the length of each menstrual cycle and identifying when you are fertile.  · Ovulation method. This is avoiding sexual intercourse during ovulation.  · Symptothermal method. This is avoiding sexual intercourse during ovulation, using a thermometer and ovulation symptoms.  · Post-ovulation method. This is timing sexual intercourse after you have ovulated.  Regardless of which type or method of contraception you choose, it is important that you use condoms to protect against the transmission of sexually transmitted diseases (STDs). Talk with your caregiver about which form of contraception is most appropriate for you.  Document Released: 06/20/2005 Document Revised: 09/12/2011 Document Reviewed: 10/27/2010  ExitCare® Patient Information ©2014 [employer].

## 2013-05-13 ENCOUNTER — Encounter: Payer: Self-pay | Admitting: Family Medicine

## 2013-05-13 ENCOUNTER — Ambulatory Visit (INDEPENDENT_AMBULATORY_CARE_PROVIDER_SITE_OTHER): Payer: Medicaid Other | Admitting: Obstetrics and Gynecology

## 2013-05-13 VITALS — BP 106/70 | Temp 97.0°F | Wt 183.3 lb

## 2013-05-13 DIAGNOSIS — O2390 Unspecified genitourinary tract infection in pregnancy, unspecified trimester: Secondary | ICD-10-CM

## 2013-05-13 DIAGNOSIS — O24912 Unspecified diabetes mellitus in pregnancy, second trimester: Secondary | ICD-10-CM

## 2013-05-13 DIAGNOSIS — E669 Obesity, unspecified: Secondary | ICD-10-CM

## 2013-05-13 DIAGNOSIS — O358XX1 Maternal care for other (suspected) fetal abnormality and damage, fetus 1: Secondary | ICD-10-CM

## 2013-05-13 DIAGNOSIS — O358XX0 Maternal care for other (suspected) fetal abnormality and damage, not applicable or unspecified: Secondary | ICD-10-CM

## 2013-05-13 DIAGNOSIS — O24919 Unspecified diabetes mellitus in pregnancy, unspecified trimester: Secondary | ICD-10-CM

## 2013-05-13 DIAGNOSIS — B951 Streptococcus, group B, as the cause of diseases classified elsewhere: Secondary | ICD-10-CM

## 2013-05-13 LAB — POCT URINALYSIS DIP (DEVICE)
Bilirubin Urine: NEGATIVE
Glucose, UA: NEGATIVE mg/dL
Hgb urine dipstick: NEGATIVE
Ketones, ur: 40 mg/dL — AB
Nitrite: NEGATIVE
Urobilinogen, UA: 0.2 mg/dL (ref 0.0–1.0)
pH: 7 (ref 5.0–8.0)

## 2013-05-13 NOTE — Patient Instructions (Signed)

## 2013-05-13 NOTE — Addendum Note (Signed)
Addended by: Danae Orleans on: 05/13/2013 09:39 AM   Modules accepted: Orders

## 2013-05-13 NOTE — Addendum Note (Signed)
Addended by: Franchot Mimes on: 05/13/2013 09:07 AM   Modules accepted: Orders

## 2013-05-13 NOTE — Progress Notes (Addendum)
Reviewed CBGs and spoke with Harriett Sine since > 50% elevated through day> will increase glyburide AM dose to 750 and continue 750 mg in pm. Discussed diet and cut out yogurt. No UTI sx, Mod LE> urine C&S Explained bilat fetal renal pyelectasis and plan for F/U US.

## 2013-05-13 NOTE — Progress Notes (Signed)
P= 80 

## 2013-05-15 LAB — CULTURE, OB URINE: Organism ID, Bacteria: >=100000

## 2013-05-19 ENCOUNTER — Other Ambulatory Visit (HOSPITAL_COMMUNITY): Payer: Self-pay | Admitting: Medical

## 2013-05-20 ENCOUNTER — Encounter (HOSPITAL_COMMUNITY): Payer: Self-pay

## 2013-05-20 ENCOUNTER — Ambulatory Visit (INDEPENDENT_AMBULATORY_CARE_PROVIDER_SITE_OTHER): Payer: Medicaid Other | Admitting: Obstetrics & Gynecology

## 2013-05-20 ENCOUNTER — Inpatient Hospital Stay (HOSPITAL_COMMUNITY)
Admission: AD | Admit: 2013-05-20 | Discharge: 2013-05-20 | Disposition: A | Discharge status: Home / Self Care | Payer: Medicaid Other | Source: Ambulatory Visit | Attending: Family Medicine | Admitting: Family Medicine

## 2013-05-20 ENCOUNTER — Encounter: Payer: Self-pay | Admitting: *Deleted

## 2013-05-20 VITALS — BP 102/70 | Temp 96.6°F | Wt 184.0 lb

## 2013-05-20 DIAGNOSIS — K219 Gastro-esophageal reflux disease without esophagitis: Secondary | ICD-10-CM | POA: Insufficient documentation

## 2013-05-20 DIAGNOSIS — M79609 Pain in unspecified limb: Secondary | ICD-10-CM | POA: Insufficient documentation

## 2013-05-20 DIAGNOSIS — B951 Streptococcus, group B, as the cause of diseases classified elsewhere: Secondary | ICD-10-CM | POA: Insufficient documentation

## 2013-05-20 DIAGNOSIS — M259 Joint disorder, unspecified: Secondary | ICD-10-CM | POA: Insufficient documentation

## 2013-05-20 DIAGNOSIS — M5430 Sciatica, unspecified side: Secondary | ICD-10-CM | POA: Insufficient documentation

## 2013-05-20 DIAGNOSIS — M79605 Pain in left leg: Secondary | ICD-10-CM

## 2013-05-20 DIAGNOSIS — O24919 Unspecified diabetes mellitus in pregnancy, unspecified trimester: Secondary | ICD-10-CM

## 2013-05-20 DIAGNOSIS — O24912 Unspecified diabetes mellitus in pregnancy, second trimester: Secondary | ICD-10-CM

## 2013-05-20 DIAGNOSIS — M899 Disorder of bone, unspecified: Secondary | ICD-10-CM | POA: Insufficient documentation

## 2013-05-20 LAB — POCT URINALYSIS DIP (DEVICE)
Glucose, UA: NEGATIVE mg/dL
Hgb urine dipstick: NEGATIVE
Nitrite: NEGATIVE
Protein, ur: NEGATIVE mg/dL
Specific Gravity, Urine: 1.015 (ref 1.005–1.030)
Urobilinogen, UA: 0.2 mg/dL (ref 0.0–1.0)

## 2013-05-20 LAB — OB RESULTS CONSOLE GBS: GBS: POSITIVE

## 2013-05-20 LAB — GLUCOSE, CAPILLARY: Glucose-Capillary: 106 mg/dL — ABNORMAL HIGH (ref 70–99)

## 2013-05-20 MED ORDER — OXYCODONE-ACETAMINOPHEN 5-325 MG PO TABS
2.0000 | ORAL_TABLET | ORAL | Status: AC
  Administered 2013-05-20: 2 via ORAL
  Filled 2013-05-20: qty 2

## 2013-05-20 MED ORDER — CALCIUM CARBONATE ANTACID 500 MG PO CHEW: 1.0000 | CHEWABLE_TABLET | Freq: Three times a day (TID) | ORAL | Status: DC

## 2013-05-20 MED ORDER — PRENATAL COMPLETE 14-0.4 MG PO TABS: 1.0000 | ORAL_TABLET | Freq: Every day | ORAL | Status: DC

## 2013-05-20 MED ORDER — RANITIDINE HCL 150 MG PO TABS: 150.0000 mg | ORAL_TABLET | Freq: Two times a day (BID) | ORAL | Status: DC

## 2013-05-20 MED ORDER — ENOXAPARIN SODIUM 100 MG/ML ~~LOC~~ SOLN
1.0000 mg/kg | SUBCUTANEOUS | Status: AC
  Administered 2013-05-20: 85 mg via SUBCUTANEOUS
  Filled 2013-05-20: qty 1

## 2013-05-20 MED ORDER — GI COCKTAIL ~~LOC~~
30.0000 mL | ORAL | Status: AC
  Administered 2013-05-20: 30 mL via ORAL
  Filled 2013-05-20: qty 30

## 2013-05-20 MED ORDER — OXYCODONE-ACETAMINOPHEN 5-325 MG PO TABS: 1.0000 | ORAL_TABLET | Freq: Four times a day (QID) | ORAL | Status: DC | PRN

## 2013-05-20 MED ORDER — GLYBURIDE 5 MG PO TABS: ORAL_TABLET | ORAL | Status: DC

## 2013-05-20 NOTE — Progress Notes (Signed)
Pt has been eating better and is having lows with the 7.5 mg of glyburide bid.  Will decrease to 5 mg bid and see how she is doing.  Pt complaining of epigastric pain that is sometimes abdominal pain as well.  Pain is constant.  Pt has burping, too.  Pt denies spicy foods.  Discussed GERD and tums / zantac.  Cervix checked and closed.

## 2013-05-20 NOTE — Patient Instructions (Signed)
Heartburn During Pregnancy  Heartburn is a burning sensation in the chest caused by stomach acid backing up into the esophagus. Heartburn (also known as "reflux") is common in pregnancy because a certain hormone (progesterone) changes. The progesterone hormone may relax the valve that separates the esophagus from the stomach. This allows acid to go up into the esophagus, causing heartburn. Heartburn may also happen in pregnancy because the enlarging uterus pushes up on the stomach, which pushes more acid into the esophagus. This is especially true in the later stages of pregnancy. Heartburn problems usually go away after giving birth. CAUSES   The progesterone hormone.  Changing hormone levels.  The growing uterus that pushes stomach acid upward.  Large meals.  Certain foods and drinks.  Exercise.  Increased acid production. SYMPTOMS   Burning pain in the chest or lower throat.  Bitter taste in the mouth.  Coughing. DIAGNOSIS  Heartburn is typically diagnosed by your caregiver when taking a careful history of your concern. Your caregiver may order a blood test to check for a certain type of bacteria that is associated with heartburn. Sometimes, heartburn is diagnosed by prescribing a heartburn medicine to see if the symptoms improve. It is rare in pregnancy to have a procedure called an endoscopy. This is when a tube with a light and a camera on the end is used to examine the esophagus and the stomach. TREATMENT   Your caregiver may tell you to use certain over-the-counter medicines (antacids, acid reducers) for mild heartburn.  Your caregiver may prescribe medicines to decrease stomach acid or to protect your stomach lining.  Your caregiver may recommend certain diet changes.  For severe cases, your caregiver may recommend that the head of the bed be elevated on blocks. (Sleeping with more pillows is not an effective treatment as it only changes the position of your head and does  not improve the main problem of stomach acid refluxing into the esophagus.) HOME CARE INSTRUCTIONS   Take all medicines as directed by your caregiver.  Raise the head of your bed by putting blocks under the legs if instructed to by your caregiver.  Do not exercise right after eating.  Avoid eating 2 or 3 hours before bed. Do not lie down right after eating.  Eat small meals throughout the day instead of 3 large meals.  Identify foods and beverages that make your symptoms worse and avoid them. Foods you may want to avoid include:  Peppers.  Chocolate.  High-fat foods, including fried foods.  Spicy foods.  Garlic and onions.  Citrus fruits, including oranges, grapefruit, lemons, and limes.  Food containing tomatoes or tomato products.  Mint.  Carbonated and caffeinated drinks.  Vinegar. SEEK IMMEDIATE MEDICAL CARE IF:   You have severe chest pain that goes down your arm or into your jaw or neck.  You feel sweaty, dizzy, or lightheaded.  You become short of breath.  You vomit blood.  You have difficulty or pain with swallowing.  You have bloody or black, tarry stools.  You have episodes of heartburn more than 3 times a week, for more than 2 weeks. MAKE SURE YOU:  Understand these instructions.  Will watch your condition.  Will get help right away if you are not doing well or get worse. Document Released: 06/17/2000 Document Revised: 09/12/2011 Document Reviewed: 02/06/2013 Exit[employer] Patient Information 2014 [employer].  

## 2013-05-20 NOTE — MAU Provider Note (Signed)
Chief Complaint:  Leg Pain   First Provider Initiated Contact with Patient 05/20/13 1721      HPI: Laurie Hess is a 32 y.o. G3P2002 at [redacted]w[redacted]d who presents to maternity admissions reporting onset of severe left calf pain 5 hours ago while sitting in a chair at work. She was seen in clinic this morning but did not yet have these symptoms.  She reports the pain is worse when standing or putting weight on the affected leg.  She reports good fetal movement, denies shortness of breath, h/a, abdominal pain, LOF, vaginal bleeding, vaginal itching/burning, urinary symptoms, dizziness, n/v, or fever/chills.     Past Medical History: Past Medical History  Diagnosis Date  . Asthma   . Seasonal allergies   . Gestational diabetes     Past obstetric history: OB History  Gravida Para Term Preterm AB SAB TAB Ectopic Multiple Living  3 2 2       2     # Outcome Date GA Lbr Len/2nd Weight Sex Delivery Anes PTL Lv  3 CUR           2 TRM 04/29/08 110w0d  4 kg (8 lb 13.1 oz) M SVD None  Y     Comments: postdates  1 TRM 04/24/06 [redacted]w[redacted]d  3.5 kg (7 lb 11.5 oz) M SVD None  Y     Comments: postdates      Past Surgical History: Past Surgical History  Procedure Laterality Date  . No past surgeries      Family History: History reviewed. No pertinent family history.  Social History: History  Substance Use Topics  . Smoking status: Never Smoker   . Smokeless tobacco: Never Used  . Alcohol Use: No    Allergies:  Allergies  Allergen Reactions  . Dust Mite Extract Itching    Meds:  Prescriptions prior to admission  Medication Sig Dispense Refill  . albuterol (PROVENTIL HFA;VENTOLIN HFA) 108 (90 BASE) MCG/ACT inhaler Inhale 1-2 puffs into the lungs every 6 (six) hours as needed for wheezing.  1 Inhaler  1  . calcium carbonate (TUMS - DOSED IN MG ELEMENTAL CALCIUM) 500 MG chewable tablet Chew 1 tablet by mouth 3 (three) times daily as needed for indigestion or heartburn.      . glyBURIDE (DIABETA)  5 MG tablet Take one tablet by mouth twice a day (12 hours apart)  120 tablet  3  . insulin NPH (HUMULIN N,NOVOLIN N) 100 UNIT/ML injection Use as dir  10 mL  3  . insulin regular (HUMULIN R) 100 units/mL injection Use as dir  10 mL  12  . loratadine (CLARITIN) 10 MG tablet Take 10 mg by mouth daily as needed for allergies.      . metFORMIN (GLUCOPHAGE) 500 MG tablet Take 1,000 mg by mouth 2 (two) times daily with a meal.      . ranitidine (ZANTAC) 150 MG tablet Take 1 tablet (150 mg total) by mouth 2 (two) times daily.  60 tablet  6  . [DISCONTINUED] calcium carbonate (TUMS) 500 MG chewable tablet Chew 1 tablet (200 mg of elemental calcium total) by mouth 3 (three) times daily.  90 tablet  6  . [DISCONTINUED] loratadine (CLARITIN REDITABS) 10 MG dissolvable tablet Take 1 tablet (10 mg total) by mouth daily.  30 tablet  2  . [DISCONTINUED] metFORMIN (GLUCOPHAGE) 500 MG tablet Take 1 tablet (500 mg total) by mouth 2 (two) times daily with a meal.  60 tablet  5  ROS: Pertinent findings in history of present illness.  Physical Exam  Blood pressure 114/66, pulse 87, temperature 98 F (36.7 C), temperature source Oral, resp. rate 18, height 5\' 2"  (1.575 m), weight 83.462 kg (184 lb), last menstrual period 11/18/2012. GENERAL: Well-developed, well-nourished female in no acute distress.  HEENT: normocephalic HEART: normal rate RESP: normal effort ABDOMEN: Soft, non-tender, gravid appropriate for gestational age EXTREMITIES: Nontender, no edema, erythema or warmth palpable bilaterally, positive Homan's sign left side, pt tearful with movement of left leg NEURO: alert and oriented    FHT:  Baseline 145, moderate variability, accelerations present, no decelerations Contractions: None  Labs: Results for orders placed during the hospital encounter of 05/20/13 (from the past 24 hour(s))  GLUCOSE, CAPILLARY     Status: Abnormal   Collection Time    05/20/13  5:19 PM      Result Value Range    Glucose-Capillary 106 (*) 70 - 99 mg/dL    Assessment: 1. Lower extremity pain, left   2. Acid reflux     Plan: Consult Dr Erin Fulling  GI Cocktail in MAU with relief of upper abdominal burning Discharge home Vascular lab not available after 5 pm Lovenox 1mg /kg dose in MAU Venous dopplers scheduled tomorrow am at [facility] results to attending Percocet 5/325 x2 tabs in MAU and Rx for 10 tabs Zantac 150 mg BID with 5 refills already at pt pharmacy Return to MAU as needed     Medication List    ASK your doctor about these medications       albuterol 108 (90 BASE) MCG/ACT inhaler  Commonly known as:  PROVENTIL HFA;VENTOLIN HFA  Inhale 1-2 puffs into the lungs every 6 (six) hours as needed for wheezing.     calcium carbonate 500 MG chewable tablet  Commonly known as:  TUMS - dosed in mg elemental calcium  Chew 1 tablet by mouth 3 (three) times daily as needed for indigestion or heartburn.     glyBURIDE 5 MG tablet  Commonly known as:  DIABETA  Take one tablet by mouth twice a day (12 hours apart)     insulin NPH 100 UNIT/ML injection  Commonly known as:  HUMULIN N,NOVOLIN N  Use as dir     insulin regular 100 units/mL injection  Commonly known as:  HUMULIN R  Use as dir     loratadine 10 MG tablet  Commonly known as:  CLARITIN  Take 10 mg by mouth daily as needed for allergies.     metFORMIN 500 MG tablet  Commonly known as:  GLUCOPHAGE  Take 1,000 mg by mouth 2 (two) times daily with a meal.     ranitidine 150 MG tablet  Commonly known as:  ZANTAC  Take 1 tablet (150 mg total) by mouth 2 (two) times daily.        Sharen Counter Certified Nurse-Midwife 05/20/2013 5:56 PM

## 2013-05-20 NOTE — MAU Note (Signed)
Pt states has had issues with sciatica this pregnancy. Sits a lot at work. Today at work was unable to bear weight on her left leg. Pedal pulses equal. Does note pain in her calf with dorsal flexion. Denies any bleeding or lof.

## 2013-05-20 NOTE — Progress Notes (Signed)
P= 85 Pt. Reports blood sugar of 50-60s last night accompanied by N/V, shaking and sweating. This morning it was 157.  C/o of right and left upper quadrant pain since Friday.  Needs PNV refilled.

## 2013-05-21 ENCOUNTER — Telehealth: Payer: Self-pay | Admitting: *Deleted

## 2013-05-21 ENCOUNTER — Ambulatory Visit (HOSPITAL_COMMUNITY)
Admit: 2013-05-21 | Discharge: 2013-05-21 | Disposition: A | Discharge status: Home / Self Care | Payer: Medicaid Other | Attending: Family Medicine | Admitting: Family Medicine

## 2013-05-21 DIAGNOSIS — M79609 Pain in unspecified limb: Secondary | ICD-10-CM | POA: Insufficient documentation

## 2013-05-21 DIAGNOSIS — O99891 Other specified diseases and conditions complicating pregnancy: Secondary | ICD-10-CM | POA: Insufficient documentation

## 2013-05-21 DIAGNOSIS — M25559 Pain in unspecified hip: Secondary | ICD-10-CM | POA: Insufficient documentation

## 2013-05-21 DIAGNOSIS — N390 Urinary tract infection, site not specified: Secondary | ICD-10-CM

## 2013-05-21 DIAGNOSIS — M79605 Pain in left leg: Secondary | ICD-10-CM

## 2013-05-21 NOTE — MAU Provider Note (Signed)
Attestation of Attending Supervision of Advanced Practitioner (CNM/NP): Evaluation and management procedures were performed by the Advanced Practitioner under my supervision and collaboration.  I have reviewed the Advanced Practitioner's note and chart, and I agree with the management and plan.  HARRAWAY-SMITH, Rebecca Motta 9:31 AM     

## 2013-05-21 NOTE — Telephone Encounter (Signed)
Message copied by Dorothyann Peng on Tue May 21, 2013  2:16 PM ------      Message from: POE, DEIRDRE C      Created: Mon May 20, 2013  8:32 AM       Please treat Amox 500 tid x7sd ------

## 2013-05-21 NOTE — Telephone Encounter (Signed)
05/21/2013:  Message to call pt in results tab.  Call to pt, no answer, message left for pt to call office.

## 2013-05-21 NOTE — Progress Notes (Signed)
VASCULAR LAB PRELIMINARY  PRELIMINARY  PRELIMINARY  PRELIMINARY  Bilateral lower extremity venous duplex completed.    Preliminary report:  Bilateral:  No evidence of DVT, superficial thrombosis, or Baker's Cyst.   Nakayla Rorabaugh, RVS 05/21/2013, 8:57 AM

## 2013-05-22 NOTE — Telephone Encounter (Signed)
Called Aaliya and left a message with her husband we need to talk with her, please have her call the office 8am-4pm.  Per chart next appt is 05/27/13

## 2013-05-23 ENCOUNTER — Encounter: Payer: Self-pay | Admitting: *Deleted

## 2013-05-23 MED ORDER — AMOXICILLIN 500 MG PO CAPS: 500.0000 mg | ORAL_CAPSULE | Freq: Three times a day (TID) | ORAL | Status: DC

## 2013-05-23 NOTE — Telephone Encounter (Signed)
05/23/2013 RX to pharmacy. Letter sent to patient.

## 2013-05-24 ENCOUNTER — Other Ambulatory Visit: Payer: Self-pay | Admitting: Obstetrics & Gynecology

## 2013-05-24 DIAGNOSIS — O99210 Obesity complicating pregnancy, unspecified trimester: Secondary | ICD-10-CM

## 2013-05-24 DIAGNOSIS — O24919 Unspecified diabetes mellitus in pregnancy, unspecified trimester: Secondary | ICD-10-CM

## 2013-05-27 ENCOUNTER — Ambulatory Visit (INDEPENDENT_AMBULATORY_CARE_PROVIDER_SITE_OTHER): Payer: Medicaid Other | Admitting: Obstetrics & Gynecology

## 2013-05-27 ENCOUNTER — Encounter: Payer: Self-pay | Admitting: *Deleted

## 2013-05-27 ENCOUNTER — Encounter: Payer: Self-pay | Admitting: Obstetrics & Gynecology

## 2013-05-27 ENCOUNTER — Ambulatory Visit (HOSPITAL_COMMUNITY)
Admission: RE | Admit: 2013-05-27 | Discharge: 2013-05-27 | Disposition: A | Discharge status: Home / Self Care | Payer: Medicaid Other | Source: Ambulatory Visit | Attending: Family Medicine | Admitting: Family Medicine

## 2013-05-27 VITALS — BP 104/66 | HR 85 | Wt 185.0 lb

## 2013-05-27 VITALS — BP 105/63 | Temp 96.7°F | Wt 182.9 lb

## 2013-05-27 DIAGNOSIS — D696 Thrombocytopenia, unspecified: Secondary | ICD-10-CM

## 2013-05-27 DIAGNOSIS — D689 Coagulation defect, unspecified: Secondary | ICD-10-CM

## 2013-05-27 DIAGNOSIS — O358XX1 Maternal care for other (suspected) fetal abnormality and damage, fetus 1: Secondary | ICD-10-CM

## 2013-05-27 DIAGNOSIS — O358XX0 Maternal care for other (suspected) fetal abnormality and damage, not applicable or unspecified: Secondary | ICD-10-CM | POA: Insufficient documentation

## 2013-05-27 DIAGNOSIS — Z1389 Encounter for screening for other disorder: Secondary | ICD-10-CM | POA: Insufficient documentation

## 2013-05-27 DIAGNOSIS — O24913 Unspecified diabetes mellitus in pregnancy, third trimester: Secondary | ICD-10-CM

## 2013-05-27 DIAGNOSIS — O289 Unspecified abnormal findings on antenatal screening of mother: Secondary | ICD-10-CM

## 2013-05-27 DIAGNOSIS — O24919 Unspecified diabetes mellitus in pregnancy, unspecified trimester: Secondary | ICD-10-CM

## 2013-05-27 DIAGNOSIS — O99210 Obesity complicating pregnancy, unspecified trimester: Secondary | ICD-10-CM

## 2013-05-27 DIAGNOSIS — Z363 Encounter for antenatal screening for malformations: Secondary | ICD-10-CM | POA: Insufficient documentation

## 2013-05-27 DIAGNOSIS — E669 Obesity, unspecified: Secondary | ICD-10-CM

## 2013-05-27 DIAGNOSIS — Z23 Encounter for immunization: Secondary | ICD-10-CM

## 2013-05-27 LAB — CBC
MCH: 27.4 pg (ref 26.0–34.0)
MCHC: 32.8 g/dL (ref 30.0–36.0)
MCV: 83.4 fL (ref 78.0–100.0)
Platelets: 180 10*3/uL (ref 150–400)
RBC: 4.82 MIL/uL (ref 3.87–5.11)
RDW: 14.8 % (ref 11.5–15.5)

## 2013-05-27 LAB — POCT URINALYSIS DIP (DEVICE)
Nitrite: NEGATIVE
Protein, ur: NEGATIVE mg/dL
Specific Gravity, Urine: 1.025 (ref 1.005–1.030)
Urobilinogen, UA: 1 mg/dL (ref 0.0–1.0)
pH: 6 (ref 5.0–8.0)

## 2013-05-27 MED ORDER — TETANUS-DIPHTH-ACELL PERTUSSIS 5-2.5-18.5 LF-MCG/0.5 IM SUSP: 0.5000 mL | Freq: Once | INTRAMUSCULAR | Status: DC

## 2013-05-27 NOTE — Progress Notes (Signed)
Pulse- 82 Patient reports pelvic, hip and back pain/pressure

## 2013-05-27 NOTE — Patient Instructions (Signed)
Return to clinic for any obstetric concerns or go to MAU for evaluation  

## 2013-05-27 NOTE — Progress Notes (Signed)
Patient did not have Metformin last week, Pharmacy did not fill until call was made to clinic.  As a result, all sugars were slightly elevated. Since she restarted Metformin a couple of days ago, sugars have improved.  Continue Glyburide 5 mg po bid and Metformin 1000 mg po bid for now, reevaluate next week.Ultrasound done todau showed EFW 76%, nml AFV, B pyelectasis L 8 mm, R 16 mm, ?R UPJ obstruction.  Will have Peds Urology consult after delivery.  No other complaints or concerns.  Fetal movement and labor precautions reviewed. Tdap given today.

## 2013-05-27 NOTE — Progress Notes (Signed)
Laurie Hess  was seen today for an ultrasound appointment.  See full report in [facility].  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  She was offerred consultation with Peds Urology, but would prefer to wait until after delivery.  Recommendations: Recommend follow-up ultrasound examination in 4 weeks to reevaluate. Will likely require Peds urology consultation for the newborn during the first several weeks after delivery  Alpha Gula, MD

## 2013-06-03 ENCOUNTER — Encounter: Payer: Self-pay | Admitting: Obstetrics & Gynecology

## 2013-06-03 ENCOUNTER — Encounter: Payer: Self-pay | Admitting: *Deleted

## 2013-06-03 ENCOUNTER — Ambulatory Visit (INDEPENDENT_AMBULATORY_CARE_PROVIDER_SITE_OTHER): Payer: Medicaid Other | Admitting: Obstetrics & Gynecology

## 2013-06-03 VITALS — BP 98/66 | Wt 184.2 lb

## 2013-06-03 DIAGNOSIS — K219 Gastro-esophageal reflux disease without esophagitis: Secondary | ICD-10-CM

## 2013-06-03 DIAGNOSIS — E669 Obesity, unspecified: Secondary | ICD-10-CM

## 2013-06-03 LAB — POCT URINALYSIS DIP (DEVICE)
Bilirubin Urine: NEGATIVE
Glucose, UA: NEGATIVE mg/dL
Hgb urine dipstick: NEGATIVE
Nitrite: NEGATIVE
Protein, ur: NEGATIVE mg/dL
pH: 6 (ref 5.0–8.0)

## 2013-06-03 MED ORDER — PANTOPRAZOLE SODIUM 40 MG PO TBEC: 40.0000 mg | DELAYED_RELEASE_TABLET | Freq: Every day | ORAL | Status: DC

## 2013-06-03 NOTE — Patient Instructions (Signed)

## 2013-06-03 NOTE — Progress Notes (Signed)
Pulse: 82

## 2013-06-03 NOTE — Progress Notes (Signed)
States compliance with current meds, FBS, 88-107, pp 68-149, usually within range. Add Protonix for HB, try tylenol of r back pain plus heat or ice.

## 2013-06-14 ENCOUNTER — Encounter: Payer: Self-pay | Admitting: *Deleted

## 2013-06-17 ENCOUNTER — Encounter: Payer: Self-pay | Admitting: Family Medicine

## 2013-06-17 ENCOUNTER — Ambulatory Visit (INDEPENDENT_AMBULATORY_CARE_PROVIDER_SITE_OTHER): Payer: Medicaid Other | Admitting: Advanced Practice Midwife

## 2013-06-17 VITALS — BP 102/64 | Temp 96.8°F | Wt 186.6 lb

## 2013-06-17 DIAGNOSIS — E669 Obesity, unspecified: Secondary | ICD-10-CM

## 2013-06-17 DIAGNOSIS — O9989 Other specified diseases and conditions complicating pregnancy, childbirth and the puerperium: Secondary | ICD-10-CM

## 2013-06-17 DIAGNOSIS — M549 Dorsalgia, unspecified: Secondary | ICD-10-CM

## 2013-06-17 LAB — POCT URINALYSIS DIP (DEVICE)
Bilirubin Urine: NEGATIVE
Glucose, UA: NEGATIVE mg/dL
Hgb urine dipstick: NEGATIVE
Ketones, ur: 15 mg/dL — AB
Nitrite: NEGATIVE
Protein, ur: NEGATIVE mg/dL
Specific Gravity, Urine: 1.025 (ref 1.005–1.030)
Urobilinogen, UA: 0.2 mg/dL (ref 0.0–1.0)
pH: 6.5 (ref 5.0–8.0)

## 2013-06-17 MED ORDER — CYCLOBENZAPRINE HCL 10 MG PO TABS: 5.0000 mg | ORAL_TABLET | Freq: Three times a day (TID) | ORAL | Status: DC | PRN

## 2013-06-17 NOTE — Progress Notes (Signed)
Pulse- 73 Patient reports pain in her legs

## 2013-06-17 NOTE — Progress Notes (Signed)
Fasting: 70-97, 122, 116 (2 elevated values when pt had juice at night after feeling hypoglycemic. ~79) 2 hour PC: 67-120's, 159, 197, 168, 148, 154, 169. Ate too much rice. Instructed to keep log to identify foods and serving sizing leading to hyperglycemia.  F/U US for pyelectasis scheduled 06/24/2013.  C/O back pain at night interfering w/ sleep. Tylenol no help. Position alternatives, stretches, Flexeril sparingly .

## 2013-06-17 NOTE — Patient Instructions (Signed)
Back Pain in Pregnancy Back pain during pregnancy is common. It happens in about half of all pregnancies. It is important for you and your baby that you remain active during your pregnancy.If you feel that back pain is not allowing you to remain active or sleep well, it is time to see your caregiver. Back pain may be caused by several factors related to changes during your pregnancy.Fortunately, unless you had trouble with your back before your pregnancy, the pain is likely to get better after you deliver. Low back pain usually occurs between the fifth and seventh months of pregnancy. It can, however, happen in the first couple months. Factors that increase the risk of back problems include:   Previous back problems.  Injury to your back.  Having twins or multiple births.  A chronic cough.  Stress.  Job-related repetitive motions.  Muscle or spinal disease in the back.  Family history of back problems, ruptured (herniated) discs, or osteoporosis.  Depression, anxiety, and panic attacks. CAUSES   When you are pregnant, your body produces a hormone called relaxin. This hormonemakes the ligaments connecting the low back and pubic bones more flexible. This flexibility allows the baby to be delivered more easily. When your ligaments are loose, your muscles need to work harder to support your back. Soreness in your back can come from tired muscles. Soreness can also come from back tissues that are irritated since they are receiving less support.  As the baby grows, it puts pressure on the nerves and blood vessels in your pelvis. This can cause back pain.  As the baby grows and gets heavier during pregnancy, the uterus pushes the stomach muscles forward and changes your center of gravity. This makes your back muscles work harder to maintain good posture. SYMPTOMS  Lumbar pain during pregnancy Lumbar pain during pregnancy usually occurs at or above the waist in the center of the back. There  may be pain and numbness that radiates into your leg or foot. This is similar to low back pain experienced by non-pregnant women. It usually increases with sitting for long periods of time, standing, or repetitive lifting. Tenderness may also be present in the muscles along your upper back. Posterior pelvic pain during pregnancy Pain in the back of the pelvis is more common than lumbar pain in pregnancy. It is a deep pain felt in your side at the waistline, or across the tailbone (sacrum), or in both places. You may have pain on one or both sides. This pain can also go into the buttocks and backs of the upper thighs. Pubic and groin pain may also be present. The pain does not quickly resolve with rest, and morning stiffness may also be present. Pelvic pain during pregnancy can be brought on by most activities. A high level of fitness before and during pregnancy may or may not prevent this problem. Labor pain is usually 1 to 2 minutes apart, lasts for about 1 minute, and involves a bearing down feeling or pressure in your pelvis. However, if you are at term with the pregnancy, constant low back pain can be the beginning of early labor, and you should be aware of this. DIAGNOSIS  X-rays of the back should not be done during the first 12 to 14 weeks of the pregnancy and only when absolutely necessary during the rest of the pregnancy. MRIs do not give off radiation and are safe during pregnancy. MRIs also should only be done when absolutely necessary. HOME CARE INSTRUCTIONS  Exercise   as directed by your caregiver. Exercise is the most effective way to prevent or manage back pain. If you have a back problem, it is especially important to avoid sports that require sudden body movements. Swimming and walking are great activities.  Do not stand in one place for long periods of time.  Do not wear high heels.  Sit in chairs with good posture. Use a pillow on your lower back if necessary. Make sure your head  rests over your shoulders and is not hanging forward.  Try sleeping on your side, preferably the left side, with a pillow or two between your legs. If you are sore after a night's rest, your bedmay betoo soft.Try placing a board between your mattress and box spring.  Listen to your body when lifting.If you are experiencing pain, ask for help or try bending yourknees more so you can use your leg muscles rather than your back muscles. Squat down when picking up something from the floor. Do not bend over.  Eat a healthy diet. Try to gain weight within your caregiver's recommendations.  Use heat or cold packs 3 to 4 times a day for 15 minutes to help with the pain.  Only take over-the-counter or prescription medicines for pain, discomfort, or fever as directed by your caregiver. Sudden (acute) back pain  Use bed rest for only the most extreme, acute episodes of back pain. Prolonged bed rest over 48 hours will aggravate your condition.  Ice is very effective for acute conditions.  Put ice in a plastic bag.  Place a towel between your skin and the bag.  Leave the ice on for 10 to 20 minutes every 2 hours, or as needed.  Using heat packs for 30 minutes prior to activities is also helpful. Continued back pain See your caregiver if you have continued problems. Your caregiver can help or refer you for appropriate physical therapy. With conditioning, most back problems can be avoided. Sometimes, a more serious issue may be the cause of back pain. You should be seen right away if new problems seem to be developing. Your caregiver may recommend:  A maternity girdle.  An elastic sling.  A back brace.  A massage therapist or acupuncture. SEEK MEDICAL CARE IF:   You are not able to do most of your daily activities, even when taking the pain medicine you were given.  You need a referral to a physical therapist or chiropractor.  You want to try acupuncture. SEEK IMMEDIATE MEDICAL CARE  IF:  You develop numbness, tingling, weakness, or problems with the use of your arms or legs.  You develop severe back pain that is no longer relieved with medicines.  You have a sudden change in bowel or bladder control.  You have increasing pain in other areas of the body.  You develop shortness of breath, dizziness, or fainting.  You develop nausea, vomiting, or sweating.  You have back pain which is similar to labor pains.  You have back pain along with your water breaking or vaginal bleeding.  You have back pain or numbness that travels down your leg.  Your back pain developed after you fell.  You develop pain on one side of your back. You may have a kidney stone.  You see blood in your urine. You may have a bladder infection or kidney stone.  You have back pain with blisters. You may have shingles. Back pain is fairly common during pregnancy but should not be accepted as just part of   the process. Back pain should always be treated as soon as possible. This will make your pregnancy as pleasant as possible. Document Released: 09/28/2005 Document Revised: 09/12/2011 Document Reviewed: 11/09/2010 [facility] Patient Information 2014 [place], [place].  Third Trimester of Pregnancy The third trimester is from week 29 through week 42, months 7 through 9. The third trimester is a time when the fetus is growing rapidly. At the end of the ninth month, the fetus is about 20 inches in length and weighs 6 10 pounds.  BODY CHANGES Your body goes through many changes during pregnancy. The changes vary from woman to woman.   Your weight will continue to increase. You can expect to gain 25 35 pounds (11 16 kg) by the end of the pregnancy.  You may begin to get stretch marks on your hips, abdomen, and breasts.  You may urinate more often because the fetus is moving lower into your pelvis and pressing on your bladder.  You may develop or continue to have heartburn as a result of your  pregnancy.  You may develop constipation because certain hormones are causing the muscles that push waste through your intestines to slow down.  You may develop hemorrhoids or swollen, bulging veins (varicose veins).  You may have pelvic pain because of the weight gain and pregnancy hormones relaxing your joints between the bones in your pelvis. Back aches may result from over exertion of the muscles supporting your posture.  Your breasts will continue to grow and be tender. A yellow discharge may leak from your breasts called colostrum.  Your belly button may stick out.  You may feel short of breath because of your expanding uterus.  You may notice the fetus "dropping," or moving lower in your abdomen.  You may have a bloody mucus discharge. This usually occurs a few days to a week before labor begins.  Your cervix becomes thin and soft (effaced) near your due date. WHAT TO EXPECT AT YOUR PRENATAL EXAMS  You will have prenatal exams every 2 weeks until week 36. Then, you will have weekly prenatal exams. During a routine prenatal visit:  You will be weighed to make sure you and the fetus are growing normally.  Your blood pressure is taken.  Your abdomen will be measured to track your baby's growth.  The fetal heartbeat will be listened to.  Any test results from the previous visit will be discussed.  You may have a cervical check near your due date to see if you have effaced. At around 36 weeks, your caregiver will check your cervix. At the same time, your caregiver will also perform a test on the secretions of the vaginal tissue. This test is to determine if a type of bacteria, Group B streptococcus, is present. Your caregiver will explain this further. Your caregiver may ask you:  What your birth plan is.  How you are feeling.  If you are feeling the baby move.  If you have had any abnormal symptoms, such as leaking fluid, bleeding, severe headaches, or abdominal  cramping.  If you have any questions. Other tests or screenings that may be performed during your third trimester include:  Blood tests that check for low iron levels (anemia).  Fetal testing to check the health, activity level, and growth of the fetus. Testing is done if you have certain medical conditions or if there are problems during the pregnancy. FALSE LABOR You may feel small, irregular contractions that eventually go away. These are called Deberah PeltonBraxton Hicks  contractions, or false labor. Contractions may last for hours, days, or even weeks before true labor sets in. If contractions come at regular intervals, intensify, or become painful, it is best to be seen by your caregiver.  SIGNS OF LABOR   Menstrual-like cramps.  Contractions that are 5 minutes apart or less.  Contractions that start on the top of the uterus and spread down to the lower abdomen and back.  A sense of increased pelvic pressure or back pain.  A watery or bloody mucus discharge that comes from the vagina. If you have any of these signs before the 37th week of pregnancy, call your caregiver right away. You need to go to the hospital to get checked immediately. HOME CARE INSTRUCTIONS   Avoid all smoking, herbs, alcohol, and unprescribed drugs. These chemicals affect the formation and growth of the baby.  Follow your caregiver's instructions regarding medicine use. There are medicines that are either safe or unsafe to take during pregnancy.  Exercise only as directed by your caregiver. Experiencing uterine cramps is a good sign to stop exercising.  Continue to eat regular, healthy meals.  Wear a good support bra for breast tenderness.  Do not use hot tubs, steam rooms, or saunas.  Wear your seat belt at all times when driving.  Avoid raw meat, uncooked cheese, cat litter boxes, and soil used by cats. These carry germs that can cause birth defects in the baby.  Take your prenatal vitamins.  Try taking a  stool softener (if your caregiver approves) if you develop constipation. Eat more high-fiber foods, such as fresh vegetables or fruit and whole grains. Drink plenty of fluids to keep your urine clear or pale yellow.  Take warm sitz baths to soothe any pain or discomfort caused by hemorrhoids. Use hemorrhoid cream if your caregiver approves.  If you develop varicose veins, wear support hose. Elevate your feet for 15 minutes, 3 4 times a day. Limit salt in your diet.  Avoid heavy lifting, wear low heal shoes, and practice good posture.  Rest a lot with your legs elevated if you have leg cramps or low back pain.  Visit your dentist if you have not gone during your pregnancy. Use a soft toothbrush to brush your teeth and be gentle when you floss.  A sexual relationship may be continued unless your caregiver directs you otherwise.  Do not travel far distances unless it is absolutely necessary and only with the approval of your caregiver.  Take prenatal classes to understand, practice, and ask questions about the labor and delivery.  Make a trial run to the hospital.  Pack your hospital bag.  Prepare the baby's nursery.  Continue to go to all your prenatal visits as directed by your caregiver. SEEK MEDICAL CARE IF:  You are unsure if you are in labor or if your water has broken.  You have dizziness.  You have mild pelvic cramps, pelvic pressure, or nagging pain in your abdominal area.  You have persistent nausea, vomiting, or diarrhea.  You have a bad smelling vaginal discharge.  You have pain with urination. SEEK IMMEDIATE MEDICAL CARE IF:   You have a fever.  You are leaking fluid from your vagina.  You have spotting or bleeding from your vagina.  You have severe abdominal cramping or pain.  You have rapid weight loss or gain.  You have shortness of breath with chest pain.  You notice sudden or extreme swelling of your face, hands, ankles, feet, or legs.  You have  not felt your baby move in over an hour.  You have severe headaches that do not go away with medicine.  You have vision changes. Document Released: 06/14/2001 Document Revised: 02/20/2013 Document Reviewed: 08/21/2012 [facility] Patient Information 2014 [place], [place].

## 2013-06-24 ENCOUNTER — Ambulatory Visit (HOSPITAL_COMMUNITY)
Admission: RE | Admit: 2013-06-24 | Discharge: 2013-06-24 | Disposition: A | Discharge status: Home / Self Care | Payer: Medicaid Other | Source: Ambulatory Visit | Attending: Obstetrics & Gynecology | Admitting: Obstetrics & Gynecology

## 2013-06-24 ENCOUNTER — Encounter: Payer: Self-pay | Admitting: Family Medicine

## 2013-06-24 DIAGNOSIS — Z1389 Encounter for screening for other disorder: Secondary | ICD-10-CM | POA: Insufficient documentation

## 2013-06-24 DIAGNOSIS — Z363 Encounter for antenatal screening for malformations: Secondary | ICD-10-CM | POA: Insufficient documentation

## 2013-06-24 DIAGNOSIS — O24919 Unspecified diabetes mellitus in pregnancy, unspecified trimester: Secondary | ICD-10-CM | POA: Insufficient documentation

## 2013-06-24 DIAGNOSIS — O358XX1 Maternal care for other (suspected) fetal abnormality and damage, fetus 1: Secondary | ICD-10-CM

## 2013-06-24 DIAGNOSIS — O358XX0 Maternal care for other (suspected) fetal abnormality and damage, not applicable or unspecified: Secondary | ICD-10-CM | POA: Insufficient documentation

## 2013-07-01 ENCOUNTER — Ambulatory Visit (INDEPENDENT_AMBULATORY_CARE_PROVIDER_SITE_OTHER): Payer: Medicaid Other | Admitting: Family Medicine

## 2013-07-01 VITALS — BP 107/66 | Temp 97.6°F | Wt 193.2 lb

## 2013-07-01 DIAGNOSIS — O24912 Unspecified diabetes mellitus in pregnancy, second trimester: Secondary | ICD-10-CM

## 2013-07-01 DIAGNOSIS — O24919 Unspecified diabetes mellitus in pregnancy, unspecified trimester: Secondary | ICD-10-CM

## 2013-07-01 LAB — POCT URINALYSIS DIP (DEVICE)
Bilirubin Urine: NEGATIVE
Glucose, UA: 100 mg/dL — AB
Ketones, ur: 15 mg/dL — AB
Nitrite: NEGATIVE
Protein, ur: NEGATIVE mg/dL
Specific Gravity, Urine: 1.025 (ref 1.005–1.030)
pH: 6 (ref 5.0–8.0)

## 2013-07-01 MED ORDER — GLYBURIDE 5 MG PO TABS: 7.5000 mg | ORAL_TABLET | Freq: Two times a day (BID) | ORAL | Status: DC

## 2013-07-01 NOTE — Progress Notes (Signed)
FBS 89-112 (5 of 7) out of range 2 hr pp 64-215 (13 of 21) out of range Will increase Glyburide to 7.5 mg bid Begin 2x/wk testing next week. Has U/S for growth next week.

## 2013-07-01 NOTE — Progress Notes (Signed)
P= 89 Pt. Reports intermittent pain in both legs.

## 2013-07-01 NOTE — Patient Instructions (Addendum)
Take 1 1/2 pills or 7.5 mg twice daily for your blood sugar. Gestational Diabetes Mellitus Gestational diabetes mellitus, often simply referred to as gestational diabetes, is a type of diabetes that some women develop during pregnancy. In gestational diabetes, the pancreas does not make enough insulin (a hormone), the cells are less responsive to the insulin that is made (insulin resistance), or both.Normally, insulin moves sugars from food into the tissue cells. The tissue cells use the sugars for energy. The lack of insulin or the lack of normal response to insulin causes excess sugars to build up in the blood instead of going into the tissue cells. As a result, high blood sugar (hyperglycemia) develops. The effect of high sugar (glucose) levels can cause many complications.  RISK FACTORS You have an increased chance of developing gestational diabetes if you have a family history of diabetes and also have one or more of the following risk factors:  A body mass index over 30 (obesity).  A previous pregnancy with gestational diabetes.  An older age at the time of pregnancy. If blood glucose levels are kept in the normal range during pregnancy, women can have a healthy pregnancy. If your blood glucose levels are not well controlled, there may be risks to you, your unborn baby (fetus), your labor and delivery, or your newborn baby.  SYMPTOMS  If symptoms are experienced, they are much like symptoms you would normally expect during pregnancy. The symptoms of gestational diabetes include:   Increased thirst (polydipsia).  Increased urination (polyuria).  Increased urination during the night (nocturia).  Weight loss. This weight loss may be rapid.  Frequent, recurring infections.  Tiredness (fatigue).  Weakness.  Vision changes, such as blurred vision.  Fruity smell to your breath.  Abdominal pain. DIAGNOSIS Diabetes is diagnosed when blood glucose levels are increased. Your blood  glucose level may be checked by one or more of the following blood tests:  A fasting blood glucose test. You will not be allowed to eat for at least 8 hours before a blood sample is taken.  A random blood glucose test. Your blood glucose is checked at any time of the day regardless of when you ate.  A hemoglobin A1c blood glucose test. A hemoglobin A1c test provides information about blood glucose control over the previous 3 months.  An oral glucose tolerance test (OGTT). Your blood glucose is measured after you have not eaten (fasted) for 1 3 hours and then after you drink a glucose-containing beverage. Since the hormones that cause insulin resistance are highest at about 24 28 weeks of a pregnancy, an OGTT is usually performed during that time. If you have risk factors for gestational diabetes, your caregiver may test you for gestational diabetes earlier than 24 weeks of pregnancy. TREATMENT   You will need to take diabetes medicine or insulin daily to keep blood glucose levels in the desired range.  You will need to match insulin dosing with exercise and healthy food choices. The treatment goal is to maintain the before meal (preprandial), bedtime, and overnight blood glucose level at 60 99 mg/dL during pregnancy. The treatment goal is to further maintain peak after meal blood sugar (postprandial glucose) level at 100 140 mg/dL. HOME CARE INSTRUCTIONS   Have your hemoglobin A1c level checked twice a year.  Perform daily blood glucose monitoring as directed by your caregiver. It is common to perform frequent blood glucose monitoring.  Monitor urine ketones when you are ill and as directed by your caregiver.  Take your diabetes medicine and insulin as directed by your caregiver to maintain your blood glucose level in the desired range.  Never run out of diabetes medicine or insulin. It is needed every day.  Adjust insulin based on your intake of carbohydrates. Carbohydrates can raise  blood glucose levels but need to be included in your diet. Carbohydrates provide vitamins, minerals, and fiber which are an essential part of a healthy diet. Carbohydrates are found in fruits, vegetables, whole grains, dairy products, legumes, and foods containing added sugars.    Eat healthy foods. Alternate 3 meals with 3 snacks.  Maintain a healthy weight gain. The usual total expected weight gain varies according to your prepregnancy body mass index (BMI).  Carry a medical alert card or wear your medical alert jewelry.  Carry a 15 gram carbohydrate snack with you at all times to treat low blood glucose (hypoglycemia). Some examples of 15 gram carbohydrate snacks include:  Glucose tablets, 3 or 4   Glucose gel, 15 gram tube  Raisins, 2 tablespoons (24 g)  Jelly beans, 6  Animal crackers, 8  Fruit juice, regular soda, or low fat milk, 4 ounces (120 mL)  Gummy treats, 9    Recognize hypoglycemia. Hypoglycemia during pregnancy occurs with blood glucose levels of 60 mg/dL and below. The risk for hypoglycemia increases when fasting or skipping meals, during or after intense exercise, and during sleep. Hypoglycemia symptoms can include:  Tremors or shakes.  Decreased ability to concentrate.  Sweating.  Increased heart rate.  Headache.  Dry mouth.  Hunger.  Irritability.  Anxiety.  Restless sleep.  Altered speech or coordination.  Confusion.  Treat hypoglycemia promptly. If you are alert and able to safely swallow, follow the 15:15 rule:  Take 15 20 grams of rapid-acting glucose or carbohydrate. Rapid-acting options include glucose gel, glucose tablets, or 4 ounces (120 mL) of fruit juice, regular soda, or low fat milk.  Check your blood glucose level 15 minutes after taking the glucose.   Take 15 20 grams more of glucose if the repeat blood glucose level is still 70 mg/dL or below.  Eat a meal or snack within 1 hour once blood glucose levels return to  normal.  Be alert to polyuria and polydipsia which are early signs of hyperglycemia. An early awareness of hyperglycemia allows for prompt treatment. Treat hyperglycemia as directed by your caregiver.  Engage in at least 30 minutes of physical activity a day or as directed by your caregiver. Ten minutes of physical activity timed 30 minutes after each meal is encouraged to control postprandial blood glucose levels.  Adjust your insulin dosing and food intake as needed if you start a new exercise or sport.  Follow your sick day plan at any time you are unable to eat or drink as usual.  Avoid tobacco and alcohol use.  Follow up with your caregiver regularly.  Follow the advice of your caregiver regarding your prenatal and post-delivery (postpartum) appointments, meal planning, exercise, medicines, vitamins, blood tests, other medical tests, and physical activities.  Perform daily skin and foot care. Examine your skin and feet daily for cuts, bruises, redness, nail problems, bleeding, blisters, or sores.  Brush your teeth and gums at least twice a day and floss at least once a day. Follow up with your dentist regularly.  Schedule an eye exam during the first trimester of your pregnancy or as directed by your caregiver.  Share your diabetes management plan with your workplace or school.  Stay up-to-date  with immunizations.  Learn to manage stress.  Obtain ongoing diabetes education and support as needed. SEEK MEDICAL CARE IF:   You are unable to eat food or drink fluids for more than 6 hours.  You have nausea and vomiting for more than 6 hours.  You have a blood glucose level of 200 mg/dL and you have ketones in your urine.  There is a change in mental status.  You develop vision problems.  You have a persistent headache.  You have upper abdominal pain or discomfort.  You develop an additional serious illness.  You have diarrhea for more than 6 hours.  You have been sick  or have had a fever for a couple of days and are not getting better. SEEK IMMEDIATE MEDICAL CARE IF:   You have difficulty breathing.  You no longer feel the baby moving.  You are bleeding or have discharge from your vagina.  You start having premature contractions or labor. MAKE SURE YOU:  Understand these instructions.  Will watch your condition.  Will get help right away if you are not doing well or get worse. Document Released: 09/26/2000 Document Revised: 10/15/2012 Document Reviewed: 01/17/2012 [facility] Patient Information 2014 [place], [place].  Breastfeeding Deciding to breastfeed is one of the best choices you can make for you and your baby. A change in hormones during pregnancy causes your breast tissue to grow and increases the number and size of your milk ducts. These hormones also allow proteins, sugars, and fats from your blood supply to make breast milk in your milk-producing glands. Hormones prevent breast milk from being released before your baby is born as well as prompt milk flow after birth. Once breastfeeding has begun, thoughts of your baby, as well as his or her sucking or crying, can stimulate the release of milk from your milk-producing glands.  BENEFITS OF BREASTFEEDING For Your Baby  Your first milk (colostrum) helps your baby's digestive system function better.   There are antibodies in your milk that help your baby fight off infections.   Your baby has a lower incidence of asthma, allergies, and sudden infant death syndrome.   The nutrients in breast milk are better for your baby than infant formulas and are designed uniquely for your baby's needs.   Breast milk improves your baby's brain development.   Your baby is less likely to develop other conditions, such as childhood obesity, asthma, or type 2 diabetes mellitus.  For You   Breastfeeding helps to create a very special bond between you and your baby.   Breastfeeding is convenient. Breast  milk is always available at the correct temperature and costs nothing.   Breastfeeding helps to burn calories and helps you lose the weight gained during pregnancy.   Breastfeeding makes your uterus contract to its prepregnancy size faster and slows bleeding (lochia) after you give birth.   Breastfeeding helps to lower your risk of developing type 2 diabetes mellitus, osteoporosis, and breast or ovarian cancer later in life. SIGNS THAT YOUR BABY IS HUNGRY Early Signs of Hunger  Increased alertness or activity.  Stretching.  Movement of the head from side to side.  Movement of the head and opening of the mouth when the corner of the mouth or cheek is stroked (rooting).  Increased sucking sounds, smacking lips, cooing, sighing, or squeaking.  Hand-to-mouth movements.  Increased sucking of fingers or hands. Late Signs of Hunger  Fussing.  Intermittent crying. Extreme Signs of Hunger Signs of extreme hunger will require calming  and consoling before your baby will be able to breastfeed successfully. Do not wait for the following signs of extreme hunger to occur before you initiate breastfeeding:   Restlessness.  A loud, strong cry.   Screaming. BREASTFEEDING BASICS Breastfeeding Initiation  Find a comfortable place to sit or lie down, with your neck and back well supported.  Place a pillow or rolled up blanket under your baby to bring him or her to the level of your breast (if you are seated). Nursing pillows are specially designed to help support your arms and your baby while you breastfeed.  Make sure that your baby's abdomen is facing your abdomen.   Gently massage your breast. With your fingertips, massage from your chest wall toward your nipple in a circular motion. This encourages milk flow. You may need to continue this action during the feeding if your milk flows slowly.  Support your breast with 4 fingers underneath and your thumb above your nipple. Make sure  your fingers are well away from your nipple and your baby's mouth.   Stroke your baby's lips gently with your finger or nipple.   When your baby's mouth is open wide enough, quickly bring your baby to your breast, placing your entire nipple and as much of the colored area around your nipple (areola) as possible into your baby's mouth.   More areola should be visible above your baby's upper lip than below the lower lip.   Your baby's tongue should be between his or her lower gum and your breast.   Ensure that your baby's mouth is correctly positioned around your nipple (latched). Your baby's lips should create a seal on your breast and be turned out (everted).  It is common for your baby to suck about 2 3 minutes in order to start the flow of breast milk. Latching Teaching your baby how to latch on to your breast properly is very important. An improper latch can cause nipple pain and decreased milk supply for you and poor weight gain in your baby. Also, if your baby is not latched onto your nipple properly, he or she may swallow some air during feeding. This can make your baby fussy. Burping your baby when you switch breasts during the feeding can help to get rid of the air. However, teaching your baby to latch on properly is still the best way to prevent fussiness from swallowing air while breastfeeding. Signs that your baby has successfully latched on to your nipple:    Silent tugging or silent sucking, without causing you pain.   Swallowing heard between every 3 4 sucks.    Muscle movement above and in front of his or her ears while sucking.  Signs that your baby has not successfully latched on to nipple:   Sucking sounds or smacking sounds from your baby while breastfeeding.  Nipple pain. If you think your baby has not latched on correctly, slip your finger into the corner of your baby's mouth to break the suction and place it between your baby's gums. Attempt breastfeeding  initiation again. Signs of Successful Breastfeeding Signs from your baby:   A gradual decrease in the number of sucks or complete cessation of sucking.   Falling asleep.   Relaxation of his or her body.   Retention of a small amount of milk in his or her mouth.   Letting go of your breast by himself or herself. Signs from you:  Breasts that have increased in firmness, weight, and size 1  3 hours after feeding.   Breasts that are softer immediately after breastfeeding.  Increased milk volume, as well as a change in milk consistency and color by the 5th day of breastfeeding.   Nipples that are not sore, cracked, or bleeding. Signs That Your Pecola Leisure is Getting Enough Milk  Wetting at least 3 diapers in a 24-hour period. The urine should be clear and pale yellow by age 32 days.  At least 3 stools in a 24-hour period by age 32 days. The stool should be soft and yellow.  At least 3 stools in a 24-hour period by age 20 days. The stool should be seedy and yellow.  No loss of weight greater than 10% of birth weight during the first 50 days of age.  Average weight gain of 4 7 ounces (120 210 mL) per week after age 87 days.  Consistent daily weight gain by age 32 days, without weight loss after the age of 2 weeks. After a feeding, your baby may spit up a small amount. This is common. BREASTFEEDING FREQUENCY AND DURATION Frequent feeding will help you make more milk and can prevent sore nipples and breast engorgement. Breastfeed when you feel the need to reduce the fullness of your breasts or when your baby shows signs of hunger. This is called "breastfeeding on demand." Avoid introducing a pacifier to your baby while you are working to establish breastfeeding (the first 4 6 weeks after your baby is born). After this time you may choose to use a pacifier. Research has shown that pacifier use during the first year of a baby's life decreases the risk of sudden infant death syndrome (SIDS). Allow  your baby to feed on each breast as long as he or she wants. Breastfeed until your baby is finished feeding. When your baby unlatches or falls asleep while feeding from the first breast, offer the second breast. Because newborns are often sleepy in the first few weeks of life, you may need to awaken your baby to get him or her to feed. Breastfeeding times will vary from baby to baby. However, the following rules can serve as a guide to help you ensure that your baby is properly fed:  Newborns (babies 11 weeks of age or younger) may breastfeed every 1 3 hours.  Newborns should not go longer than 3 hours during the day or 5 hours during the night without breastfeeding.  You should breastfeed your baby a minimum of 8 times in a 24-hour period until you begin to introduce solid foods to your baby at around 23 months of age. BREAST MILK PUMPING Pumping and storing breast milk allows you to ensure that your baby is exclusively fed your breast milk, even at times when you are unable to breastfeed. This is especially important if you are going back to work while you are still breastfeeding or when you are not able to be present during feedings. Your lactation consultant can give you guidelines on how long it is safe to store breast milk.  A breast pump is a machine that allows you to pump milk from your breast into a sterile bottle. The pumped breast milk can then be stored in a refrigerator or freezer. Some breast pumps are operated by hand, while others use electricity. Ask your lactation consultant which type will work best for you. Breast pumps can be purchased, but some hospitals and breastfeeding support groups lease breast pumps on a monthly basis. A lactation consultant can teach you how to hand  express breast milk, if you prefer not to use a pump.  CARING FOR YOUR BREASTS WHILE YOU BREASTFEED Nipples can become dry, cracked, and sore while breastfeeding. The following recommendations can help keep your  breasts moisturized and healthy:  Avoid using soap on your nipples.   Wear a supportive bra. Although not required, special nursing bras and tank tops are designed to allow access to your breasts for breastfeeding without taking off your entire bra or top. Avoid wearing underwire style bras or extremely tight bras.  Air dry your nipples for 3 after each feeding.   Use only cotton bra pads to absorb leaked breast milk. Leaking of breast milk between feedings is normal.   Use lanolin on your nipples after breastfeeding. Lanolin helps to maintain your skin's normal moisture barrier. If you use pure lanolin you do not need to wash it off before feeding your baby again. Pure lanolin is not toxic to your baby. You may also hand express a few drops of breast milk and gently massage that milk into your nipples and allow the milk to air dry. In the first few weeks after giving birth, some women experience extremely full breasts (engorgement). Engorgement can make your breasts feel heavy, warm, and tender to the touch. Engorgement peaks within 3 5 days after you give birth. The following recommendations can help ease engorgement:  Completely empty your breasts while breastfeeding or pumping. You may want to start by applying warm, moist heat (in the shower or with warm water-soaked hand towels) just before feeding or pumping. This increases circulation and helps the milk flow. If your baby does not completely empty your breasts while breastfeeding, pump any extra milk after he or she is finished.  Wear a snug bra (nursing or regular) or tank top for 1 2 days to signal your body to slightly decrease milk production.  Apply ice packs to your breasts, unless this is too uncomfortable for you.  Make sure that your baby is latched on and positioned properly while breastfeeding. If engorgement persists after 48 hours of following these recommendations, contact your health care provider or a  Advertising copywriter. OVERALL HEALTH CARE RECOMMENDATIONS WHILE BREASTFEEDING  Eat healthy foods. Alternate between meals and snacks, eating 3 of each per day. Because what you eat affects your breast milk, some of the foods may make your baby more irritable than usual. Avoid eating these foods if you are sure that they are negatively affecting your baby.  Drink milk, fruit juice, and water to satisfy your thirst (about 10 glasses a day).   Rest often, relax, and continue to take your prenatal vitamins to prevent fatigue, stress, and anemia.  Continue breast self-awareness checks.  Avoid chewing and smoking tobacco.  Avoid alcohol and drug use. Some medicines that may be harmful to your baby can pass through breast milk. It is important to ask your health care provider before taking any medicine, including all over-the-counter and prescription medicine as well as vitamin and herbal supplements. It is possible to become pregnant while breastfeeding. If birth control is desired, ask your health care provider about options that will be safe for your baby. SEEK MEDICAL CARE IF:   You feel like you want to stop breastfeeding or have become frustrated with breastfeeding.  You have painful breasts or nipples.  Your nipples are cracked or bleeding.  Your breasts are red, tender, or warm.  You have a swollen area on either breast.  You have a fever or  chills.  You have nausea or vomiting.  You have drainage other than breast milk from your nipples.  Your breasts do not become full before feedings by the 5th day after you give birth.  You feel sad and depressed.  Your baby is too sleepy to eat well.  Your baby is having trouble sleeping.   Your baby is wetting less than 3 diapers in a 24-hour period.  Your baby has less than 3 stools in a 24-hour period.  Your baby's skin or the white part of his or her eyes becomes yellow.   Your baby is not gaining weight by 5 days of  age. SEEK IMMEDIATE MEDICAL CARE IF:   Your baby is overly tired (lethargic) and does not want to wake up and feed.  Your baby develops an unexplained fever. Document Released: 06/20/2005 Document Revised: 02/20/2013 Document Reviewed: 12/12/2012 [facility] Patient Information 2014 [place], [place].

## 2013-07-08 ENCOUNTER — Encounter: Payer: Medicaid Other | Attending: Family Medicine | Admitting: *Deleted

## 2013-07-08 ENCOUNTER — Ambulatory Visit (INDEPENDENT_AMBULATORY_CARE_PROVIDER_SITE_OTHER): Payer: Medicaid Other | Admitting: Family Medicine

## 2013-07-08 ENCOUNTER — Encounter: Payer: Self-pay | Admitting: Family Medicine

## 2013-07-08 ENCOUNTER — Ambulatory Visit (HOSPITAL_COMMUNITY)
Admission: RE | Admit: 2013-07-08 | Discharge: 2013-07-08 | Disposition: A | Discharge status: Home / Self Care | Payer: Medicaid Other | Source: Ambulatory Visit | Attending: Family Medicine | Admitting: Family Medicine

## 2013-07-08 ENCOUNTER — Encounter (HOSPITAL_COMMUNITY): Payer: Self-pay

## 2013-07-08 VITALS — BP 107/65 | Wt 193.7 lb

## 2013-07-08 DIAGNOSIS — Z1231 Encounter for screening mammogram for malignant neoplasm of breast: Secondary | ICD-10-CM | POA: Insufficient documentation

## 2013-07-08 DIAGNOSIS — B951 Streptococcus, group B, as the cause of diseases classified elsewhere: Secondary | ICD-10-CM

## 2013-07-08 DIAGNOSIS — Z713 Dietary counseling and surveillance: Secondary | ICD-10-CM | POA: Insufficient documentation

## 2013-07-08 DIAGNOSIS — N390 Urinary tract infection, site not specified: Secondary | ICD-10-CM

## 2013-07-08 DIAGNOSIS — O24912 Unspecified diabetes mellitus in pregnancy, second trimester: Secondary | ICD-10-CM

## 2013-07-08 DIAGNOSIS — O99810 Abnormal glucose complicating pregnancy: Secondary | ICD-10-CM | POA: Insufficient documentation

## 2013-07-08 DIAGNOSIS — O24919 Unspecified diabetes mellitus in pregnancy, unspecified trimester: Secondary | ICD-10-CM

## 2013-07-08 DIAGNOSIS — O2340 Unspecified infection of urinary tract in pregnancy, unspecified trimester: Principal | ICD-10-CM

## 2013-07-08 DIAGNOSIS — O2390 Unspecified genitourinary tract infection in pregnancy, unspecified trimester: Secondary | ICD-10-CM

## 2013-07-08 LAB — POCT URINALYSIS DIP (DEVICE)
Bilirubin Urine: NEGATIVE
Bilirubin Urine: NEGATIVE
GLUCOSE, UA: NEGATIVE mg/dL
Glucose, UA: NEGATIVE mg/dL
HGB URINE DIPSTICK: NEGATIVE
Hgb urine dipstick: NEGATIVE
Ketones, ur: 15 mg/dL — AB
NITRITE: NEGATIVE
NITRITE: NEGATIVE
PH: 6 (ref 5.0–8.0)
PH: 6 (ref 5.0–8.0)
PROTEIN: NEGATIVE mg/dL
PROTEIN: NEGATIVE mg/dL
Specific Gravity, Urine: 1.02 (ref 1.005–1.030)
Specific Gravity, Urine: 1.02 (ref 1.005–1.030)
UROBILINOGEN UA: 0.2 mg/dL (ref 0.0–1.0)
UROBILINOGEN UA: 0.2 mg/dL (ref 0.0–1.0)

## 2013-07-08 MED ORDER — GLYBURIDE 5 MG PO TABS: 10.0000 mg | ORAL_TABLET | Freq: Two times a day (BID) | ORAL | Status: DC

## 2013-07-08 NOTE — Progress Notes (Signed)
FBS 89-122 (5 of 7 out of range) 2 hr [id number] (14 of 21 out of range) Will increase Glyburide to 10 mg bid-add exercise-- if not improved--will need insulin next week. NST reviewed and reactive.

## 2013-07-08 NOTE — Progress Notes (Signed)
DIABETES: Laurie Hess presents with her husband/translater. She appears tired and frustrated. We discussed to need for her to either drastically decrease her rice consumption or discontinue until the baby is born. I reinforced the times at which she should be testing and documenting appropriately in her log. I encouraged her to get out and walk daily either outside or [facility] or something of that nature. Did was not very engaged in the conversation. Fasting FBS range 44-178mg /dl.

## 2013-07-08 NOTE — Patient Instructions (Signed)
Gestational Diabetes Mellitus Gestational diabetes mellitus, often simply referred to as gestational diabetes, is a type of diabetes that some women develop during pregnancy. In gestational diabetes, the pancreas does not make enough insulin (a hormone), the cells are less responsive to the insulin that is made (insulin resistance), or both.Normally, insulin moves sugars from food into the tissue cells. The tissue cells use the sugars for energy. The lack of insulin or the lack of normal response to insulin causes excess sugars to build up in the blood instead of going into the tissue cells. As a result, high blood sugar (hyperglycemia) develops. The effect of high sugar (glucose) levels can cause many complications.  RISK FACTORS You have an increased chance of developing gestational diabetes if you have a family history of diabetes and also have one or more of the following risk factors:  A body mass index over 30 (obesity).  A previous pregnancy with gestational diabetes.  An older age at the time of pregnancy. If blood glucose levels are kept in the normal range during pregnancy, women can have a healthy pregnancy. If your blood glucose levels are not well controlled, there may be risks to you, your unborn baby (fetus), your labor and delivery, or your newborn baby.  SYMPTOMS  If symptoms are experienced, they are much like symptoms you would normally expect during pregnancy. The symptoms of gestational diabetes include:   Increased thirst (polydipsia).  Increased urination (polyuria).  Increased urination during the night (nocturia).  Weight loss. This weight loss may be rapid.  Frequent, recurring infections.  Tiredness (fatigue).  Weakness.  Vision changes, such as blurred vision.  Fruity smell to your breath.  Abdominal pain. DIAGNOSIS Diabetes is diagnosed when blood glucose levels are increased. Your blood glucose level may be checked by one or more of the following  blood tests:  A fasting blood glucose test. You will not be allowed to eat for at least 8 hours before a blood sample is taken.  A random blood glucose test. Your blood glucose is checked at any time of the day regardless of when you ate.  A hemoglobin A1c blood glucose test. A hemoglobin A1c test provides information about blood glucose control over the previous 3 months.  An oral glucose tolerance test (OGTT). Your blood glucose is measured after you have not eaten (fasted) for 1 3 hours and then after you drink a glucose-containing beverage. Since the hormones that cause insulin resistance are highest at about 24 28 weeks of a pregnancy, an OGTT is usually performed during that time. If you have risk factors for gestational diabetes, your caregiver may test you for gestational diabetes earlier than 24 weeks of pregnancy. TREATMENT   You will need to take diabetes medicine or insulin daily to keep blood glucose levels in the desired range.  You will need to match insulin dosing with exercise and healthy food choices. The treatment goal is to maintain the before meal (preprandial), bedtime, and overnight blood glucose level at 60 99 mg/dL during pregnancy. The treatment goal is to further maintain peak after meal blood sugar (postprandial glucose) level at 100 140 mg/dL. HOME CARE INSTRUCTIONS   Have your hemoglobin A1c level checked twice a year.  Perform daily blood glucose monitoring as directed by your caregiver. It is common to perform frequent blood glucose monitoring.  Monitor urine ketones when you are ill and as directed by your caregiver.  Take your diabetes medicine and insulin as directed by your caregiver to maintain   your blood glucose level in the desired range.  Never run out of diabetes medicine or insulin. It is needed every day.  Adjust insulin based on your intake of carbohydrates. Carbohydrates can raise blood glucose levels but need to be included in your diet.  Carbohydrates provide vitamins, minerals, and fiber which are an essential part of a healthy diet. Carbohydrates are found in fruits, vegetables, whole grains, dairy products, legumes, and foods containing added sugars.    Eat healthy foods. Alternate 3 meals with 3 snacks.  Maintain a healthy weight gain. The usual total expected weight gain varies according to your prepregnancy body mass index (BMI).  Carry a medical alert card or wear your medical alert jewelry.  Carry a 15 gram carbohydrate snack with you at all times to treat low blood glucose (hypoglycemia). Some examples of 15 gram carbohydrate snacks include:  Glucose tablets, 3 or 4   Glucose gel, 15 gram tube  Raisins, 2 tablespoons (24 g)  Jelly beans, 6  Animal crackers, 8  Fruit juice, regular soda, or low fat milk, 4 ounces (120 mL)  Gummy treats, 9    Recognize hypoglycemia. Hypoglycemia during pregnancy occurs with blood glucose levels of 60 mg/dL and below. The risk for hypoglycemia increases when fasting or skipping meals, during or after intense exercise, and during sleep. Hypoglycemia symptoms can include:  Tremors or shakes.  Decreased ability to concentrate.  Sweating.  Increased heart rate.  Headache.  Dry mouth.  Hunger.  Irritability.  Anxiety.  Restless sleep.  Altered speech or coordination.  Confusion.  Treat hypoglycemia promptly. If you are alert and able to safely swallow, follow the 15:15 rule:  Take 15 20 grams of rapid-acting glucose or carbohydrate. Rapid-acting options include glucose gel, glucose tablets, or 4 ounces (120 mL) of fruit juice, regular soda, or low fat milk.  Check your blood glucose level 15 minutes after taking the glucose.   Take 15 20 grams more of glucose if the repeat blood glucose level is still 70 mg/dL or below.  Eat a meal or snack within 1 hour once blood glucose levels return to normal.  Be alert to polyuria and polydipsia which are early  signs of hyperglycemia. An early awareness of hyperglycemia allows for prompt treatment. Treat hyperglycemia as directed by your caregiver.  Engage in at least 30 minutes of physical activity a day or as directed by your caregiver. Ten minutes of physical activity timed 30 minutes after each meal is encouraged to control postprandial blood glucose levels.  Adjust your insulin dosing and food intake as needed if you start a new exercise or sport.  Follow your sick day plan at any time you are unable to eat or drink as usual.  Avoid tobacco and alcohol use.  Follow up with your caregiver regularly.  Follow the advice of your caregiver regarding your prenatal and post-delivery (postpartum) appointments, meal planning, exercise, medicines, vitamins, blood tests, other medical tests, and physical activities.  Perform daily skin and foot care. Examine your skin and feet daily for cuts, bruises, redness, nail problems, bleeding, blisters, or sores.  Brush your teeth and gums at least twice a day and floss at least once a day. Follow up with your dentist regularly.  Schedule an eye exam during the first trimester of your pregnancy or as directed by your caregiver.  Share your diabetes management plan with your workplace or school.  Stay up-to-date with immunizations.  Learn to manage stress.  Obtain ongoing diabetes education and   support as needed. SEEK MEDICAL CARE IF:   You are unable to eat food or drink fluids for more than 6 hours.  You have nausea and vomiting for more than 6 hours.  You have a blood glucose level of 200 mg/dL and you have ketones in your urine.  There is a change in mental status.  You develop vision problems.  You have a persistent headache.  You have upper abdominal pain or discomfort.  You develop an additional serious illness.  You have diarrhea for more than 6 hours.  You have been sick or have had a fever for a couple of days and are not getting  better. SEEK IMMEDIATE MEDICAL CARE IF:   You have difficulty breathing.  You no longer feel the baby moving.  You are bleeding or have discharge from your vagina.  You start having premature contractions or labor. MAKE SURE YOU:  Understand these instructions.  Will watch your condition.  Will get help right away if you are not doing well or get worse. Document Released: 09/26/2000 Document Revised: 10/15/2012 Document Reviewed: 01/17/2012 [employer] Patient Information 2014 [employer].  Breastfeeding Deciding to breastfeed is one of the best choices you can make for you and your baby. A change in hormones during pregnancy causes your breast tissue to grow and increases the number and size of your milk ducts. These hormones also allow proteins, sugars, and fats from your blood supply to make breast milk in your milk-producing glands. Hormones prevent breast milk from being released before your baby is born as well as prompt milk flow after birth. Once breastfeeding has begun, thoughts of your baby, as well as his or her sucking or crying, can stimulate the release of milk from your milk-producing glands.  BENEFITS OF BREASTFEEDING For Your Baby  Your first milk (colostrum) helps your baby's digestive system function better.   There are antibodies in your milk that help your baby fight off infections.   Your baby has a lower incidence of asthma, allergies, and sudden infant death syndrome.   The nutrients in breast milk are better for your baby than infant formulas and are designed uniquely for your baby's needs.   Breast milk improves your baby's brain development.   Your baby is less likely to develop other conditions, such as childhood obesity, asthma, or type 2 diabetes mellitus.  For You   Breastfeeding helps to create a very special bond between you and your baby.   Breastfeeding is convenient. Breast milk is always available at the correct temperature and  costs nothing.   Breastfeeding helps to burn calories and helps you lose the weight gained during pregnancy.   Breastfeeding makes your uterus contract to its prepregnancy size faster and slows bleeding (lochia) after you give birth.   Breastfeeding helps to lower your risk of developing type 2 diabetes mellitus, osteoporosis, and breast or ovarian cancer later in life. SIGNS THAT YOUR BABY IS HUNGRY Early Signs of Hunger  Increased alertness or activity.  Stretching.  Movement of the head from side to side.  Movement of the head and opening of the mouth when the corner of the mouth or cheek is stroked (rooting).  Increased sucking sounds, smacking lips, cooing, sighing, or squeaking.  Hand-to-mouth movements.  Increased sucking of fingers or hands. Late Signs of Hunger  Fussing.  Intermittent crying. Extreme Signs of Hunger Signs of extreme hunger will require calming and consoling before your baby will be able to breastfeed successfully. Do not   wait for the following signs of extreme hunger to occur before you initiate breastfeeding:   Restlessness.  A loud, strong cry.   Screaming. BREASTFEEDING BASICS Breastfeeding Initiation  Find a comfortable place to sit or lie down, with your neck and back well supported.  Place a pillow or rolled up blanket under your baby to bring him or her to the level of your breast (if you are seated). Nursing pillows are specially designed to help support your arms and your baby while you breastfeed.  Make sure that your baby's abdomen is facing your abdomen.   Gently massage your breast. With your fingertips, massage from your chest wall toward your nipple in a circular motion. This encourages milk flow. You may need to continue this action during the feeding if your milk flows slowly.  Support your breast with 4 fingers underneath and your thumb above your nipple. Make sure your fingers are well away from your nipple and your  baby's mouth.   Stroke your baby's lips gently with your finger or nipple.   When your baby's mouth is open wide enough, quickly bring your baby to your breast, placing your entire nipple and as much of the colored area around your nipple (areola) as possible into your baby's mouth.   More areola should be visible above your baby's upper lip than below the lower lip.   Your baby's tongue should be between his or her lower gum and your breast.   Ensure that your baby's mouth is correctly positioned around your nipple (latched). Your baby's lips should create a seal on your breast and be turned out (everted).  It is common for your baby to suck about 2 3 minutes in order to start the flow of breast milk. Latching Teaching your baby how to latch on to your breast properly is very important. An improper latch can cause nipple pain and decreased milk supply for you and poor weight gain in your baby. Also, if your baby is not latched onto your nipple properly, he or she may swallow some air during feeding. This can make your baby fussy. Burping your baby when you switch breasts during the feeding can help to get rid of the air. However, teaching your baby to latch on properly is still the best way to prevent fussiness from swallowing air while breastfeeding. Signs that your baby has successfully latched on to your nipple:    Silent tugging or silent sucking, without causing you pain.   Swallowing heard between every 3 4 sucks.    Muscle movement above and in front of his or her ears while sucking.  Signs that your baby has not successfully latched on to nipple:   Sucking sounds or smacking sounds from your baby while breastfeeding.  Nipple pain. If you think your baby has not latched on correctly, slip your finger into the corner of your baby's mouth to break the suction and place it between your baby's gums. Attempt breastfeeding initiation again. Signs of Successful  Breastfeeding Signs from your baby:   A gradual decrease in the number of sucks or complete cessation of sucking.   Falling asleep.   Relaxation of his or her body.   Retention of a small amount of milk in his or her mouth.   Letting go of your breast by himself or herself. Signs from you:  Breasts that have increased in firmness, weight, and size 1 3 hours after feeding.   Breasts that are softer immediately after breastfeeding.    Increased milk volume, as well as a change in milk consistency and color by the 5th day of breastfeeding.   Nipples that are not sore, cracked, or bleeding. Signs That Your Baby is Getting Enough Milk  Wetting at least 3 diapers in a 24-hour period. The urine should be clear and pale yellow by age 5 days.  At least 3 stools in a 24-hour period by age 5 days. The stool should be soft and yellow.  At least 3 stools in a 24-hour period by age 7 days. The stool should be seedy and yellow.  No loss of weight greater than 10% of birth weight during the first 3 days of age.  Average weight gain of 4 7 ounces (120 210 mL) per week after age 4 days.  Consistent daily weight gain by age 5 days, without weight loss after the age of 2 weeks. After a feeding, your baby may spit up a small amount. This is common. BREASTFEEDING FREQUENCY AND DURATION Frequent feeding will help you make more milk and can prevent sore nipples and breast engorgement. Breastfeed when you feel the need to reduce the fullness of your breasts or when your baby shows signs of hunger. This is called "breastfeeding on demand." Avoid introducing a pacifier to your baby while you are working to establish breastfeeding (the first 4 6 weeks after your baby is born). After this time you may choose to use a pacifier. Research has shown that pacifier use during the first year of a baby's life decreases the risk of sudden infant death syndrome (SIDS). Allow your baby to feed on each breast as  long as he or she wants. Breastfeed until your baby is finished feeding. When your baby unlatches or falls asleep while feeding from the first breast, offer the second breast. Because newborns are often sleepy in the first few weeks of life, you may need to awaken your baby to get him or her to feed. Breastfeeding times will vary from baby to baby. However, the following rules can serve as a guide to help you ensure that your baby is properly fed:  Newborns (babies 4 weeks of age or younger) may breastfeed every 1 3 hours.  Newborns should not go longer than 3 hours during the day or 5 hours during the night without breastfeeding.  You should breastfeed your baby a minimum of 8 times in a 24-hour period until you begin to introduce solid foods to your baby at around 6 months of age. BREAST MILK PUMPING Pumping and storing breast milk allows you to ensure that your baby is exclusively fed your breast milk, even at times when you are unable to breastfeed. This is especially important if you are going back to work while you are still breastfeeding or when you are not able to be present during feedings. Your lactation consultant can give you guidelines on how long it is safe to store breast milk.  A breast pump is a machine that allows you to pump milk from your breast into a sterile bottle. The pumped breast milk can then be stored in a refrigerator or freezer. Some breast pumps are operated by hand, while others use electricity. Ask your lactation consultant which type will work best for you. Breast pumps can be purchased, but some hospitals and breastfeeding support groups lease breast pumps on a monthly basis. A lactation consultant can teach you how to hand express breast milk, if you prefer not to use a pump.  CARING FOR   YOUR BREASTS WHILE YOU BREASTFEED Nipples can become dry, cracked, and sore while breastfeeding. The following recommendations can help keep your breasts moisturized and  healthy:  Avoid using soap on your nipples.   Wear a supportive bra. Although not required, special nursing bras and tank tops are designed to allow access to your breasts for breastfeeding without taking off your entire bra or top. Avoid wearing underwire style bras or extremely tight bras.  Air dry your nipples for 3 4minutes after each feeding.   Use only cotton bra pads to absorb leaked breast milk. Leaking of breast milk between feedings is normal.   Use lanolin on your nipples after breastfeeding. Lanolin helps to maintain your skin's normal moisture barrier. If you use pure lanolin you do not need to wash it off before feeding your baby again. Pure lanolin is not toxic to your baby. You may also hand express a few drops of breast milk and gently massage that milk into your nipples and allow the milk to air dry. In the first few weeks after giving birth, some women experience extremely full breasts (engorgement). Engorgement can make your breasts feel heavy, warm, and tender to the touch. Engorgement peaks within 3 5 days after you give birth. The following recommendations can help ease engorgement:  Completely empty your breasts while breastfeeding or pumping. You may want to start by applying warm, moist heat (in the shower or with warm water-soaked hand towels) just before feeding or pumping. This increases circulation and helps the milk flow. If your baby does not completely empty your breasts while breastfeeding, pump any extra milk after he or she is finished.  Wear a snug bra (nursing or regular) or tank top for 1 2 days to signal your body to slightly decrease milk production.  Apply ice packs to your breasts, unless this is too uncomfortable for you.  Make sure that your baby is latched on and positioned properly while breastfeeding. If engorgement persists after 48 hours of following these recommendations, contact your health care provider or a lactation consultant. OVERALL  HEALTH CARE RECOMMENDATIONS WHILE BREASTFEEDING  Eat healthy foods. Alternate between meals and snacks, eating 3 of each per day. Because what you eat affects your breast milk, some of the foods may make your baby more irritable than usual. Avoid eating these foods if you are sure that they are negatively affecting your baby.  Drink milk, fruit juice, and water to satisfy your thirst (about 10 glasses a day).   Rest often, relax, and continue to take your prenatal vitamins to prevent fatigue, stress, and anemia.  Continue breast self-awareness checks.  Avoid chewing and smoking tobacco.  Avoid alcohol and drug use. Some medicines that may be harmful to your baby can pass through breast milk. It is important to ask your health care provider before taking any medicine, including all over-the-counter and prescription medicine as well as vitamin and herbal supplements. It is possible to become pregnant while breastfeeding. If birth control is desired, ask your health care provider about options that will be safe for your baby. SEEK MEDICAL CARE IF:   You feel like you want to stop breastfeeding or have become frustrated with breastfeeding.  You have painful breasts or nipples.  Your nipples are cracked or bleeding.  Your breasts are red, tender, or warm.  You have a swollen area on either breast.  You have a fever or chills.  You have nausea or vomiting.  You have drainage other than breast   milk from your nipples.  Your breasts do not become full before feedings by the 5th day after you give birth.  You feel sad and depressed.  Your baby is too sleepy to eat well.  Your baby is having trouble sleeping.   Your baby is wetting less than 3 diapers in a 24-hour period.  Your baby has less than 3 stools in a 24-hour period.  Your baby's skin or the white part of his or her eyes becomes yellow.   Your baby is not gaining weight by 5 days of age. SEEK IMMEDIATE MEDICAL CARE  IF:   Your baby is overly tired (lethargic) and does not want to wake up and feed.  Your baby develops an unexplained fever. Document Released: 06/20/2005 Document Revised: 02/20/2013 Document Reviewed: 12/12/2012 [employer] Patient Information 2014 [employer].  

## 2013-07-08 NOTE — Progress Notes (Signed)
Pt had NST today @ [facility]

## 2013-07-08 NOTE — Progress Notes (Signed)
P=79 

## 2013-07-09 ENCOUNTER — Other Ambulatory Visit: Payer: Self-pay | Admitting: Family Medicine

## 2013-07-09 DIAGNOSIS — O24913 Unspecified diabetes mellitus in pregnancy, third trimester: Secondary | ICD-10-CM

## 2013-07-09 DIAGNOSIS — O358XX0 Maternal care for other (suspected) fetal abnormality and damage, not applicable or unspecified: Secondary | ICD-10-CM

## 2013-07-11 ENCOUNTER — Other Ambulatory Visit: Payer: Self-pay | Admitting: Family Medicine

## 2013-07-11 ENCOUNTER — Ambulatory Visit (HOSPITAL_COMMUNITY): Admission: RE | Admit: 2013-07-11 | Payer: Medicaid Other | Source: Ambulatory Visit

## 2013-07-11 ENCOUNTER — Ambulatory Visit (HOSPITAL_COMMUNITY)
Admission: RE | Admit: 2013-07-11 | Discharge: 2013-07-11 | Disposition: A | Discharge status: Home / Self Care | Payer: Medicaid Other | Source: Ambulatory Visit | Attending: Obstetrics and Gynecology | Admitting: Obstetrics and Gynecology

## 2013-07-11 ENCOUNTER — Encounter: Payer: Self-pay | Admitting: Family Medicine

## 2013-07-11 ENCOUNTER — Other Ambulatory Visit (HOSPITAL_COMMUNITY): Payer: Medicaid Other

## 2013-07-11 ENCOUNTER — Ambulatory Visit (HOSPITAL_COMMUNITY): Payer: Medicaid Other

## 2013-07-11 DIAGNOSIS — Z363 Encounter for antenatal screening for malformations: Secondary | ICD-10-CM | POA: Insufficient documentation

## 2013-07-11 DIAGNOSIS — O358XX0 Maternal care for other (suspected) fetal abnormality and damage, not applicable or unspecified: Secondary | ICD-10-CM | POA: Insufficient documentation

## 2013-07-11 DIAGNOSIS — Z1389 Encounter for screening for other disorder: Secondary | ICD-10-CM | POA: Insufficient documentation

## 2013-07-11 DIAGNOSIS — O24913 Unspecified diabetes mellitus in pregnancy, third trimester: Secondary | ICD-10-CM

## 2013-07-11 DIAGNOSIS — O24919 Unspecified diabetes mellitus in pregnancy, unspecified trimester: Secondary | ICD-10-CM | POA: Insufficient documentation

## 2013-07-15 ENCOUNTER — Encounter: Payer: Self-pay | Admitting: Obstetrics and Gynecology

## 2013-07-15 ENCOUNTER — Ambulatory Visit (INDEPENDENT_AMBULATORY_CARE_PROVIDER_SITE_OTHER): Payer: Medicaid Other | Admitting: Obstetrics and Gynecology

## 2013-07-15 VITALS — BP 97/66 | Temp 97.0°F | Wt 197.4 lb

## 2013-07-15 DIAGNOSIS — O99119 Other diseases of the blood and blood-forming organs and certain disorders involving the immune mechanism complicating pregnancy, unspecified trimester: Secondary | ICD-10-CM

## 2013-07-15 DIAGNOSIS — O99113 Other diseases of the blood and blood-forming organs and certain disorders involving the immune mechanism complicating pregnancy, third trimester: Secondary | ICD-10-CM

## 2013-07-15 DIAGNOSIS — E669 Obesity, unspecified: Secondary | ICD-10-CM

## 2013-07-15 DIAGNOSIS — D689 Coagulation defect, unspecified: Secondary | ICD-10-CM

## 2013-07-15 DIAGNOSIS — D696 Thrombocytopenia, unspecified: Secondary | ICD-10-CM

## 2013-07-15 DIAGNOSIS — O289 Unspecified abnormal findings on antenatal screening of mother: Secondary | ICD-10-CM

## 2013-07-15 DIAGNOSIS — O24913 Unspecified diabetes mellitus in pregnancy, third trimester: Secondary | ICD-10-CM

## 2013-07-15 DIAGNOSIS — O24919 Unspecified diabetes mellitus in pregnancy, unspecified trimester: Secondary | ICD-10-CM

## 2013-07-15 DIAGNOSIS — O35EXX0 Maternal care for other (suspected) fetal abnormality and damage, fetal genitourinary anomalies, not applicable or unspecified: Secondary | ICD-10-CM

## 2013-07-15 DIAGNOSIS — O358XX0 Maternal care for other (suspected) fetal abnormality and damage, not applicable or unspecified: Secondary | ICD-10-CM

## 2013-07-15 DIAGNOSIS — O99210 Obesity complicating pregnancy, unspecified trimester: Secondary | ICD-10-CM

## 2013-07-15 LAB — POCT URINALYSIS DIP (DEVICE)
Bilirubin Urine: NEGATIVE
Glucose, UA: NEGATIVE mg/dL
Hgb urine dipstick: NEGATIVE
Ketones, ur: 80 mg/dL — AB
Nitrite: NEGATIVE
PH: 5.5 (ref 5.0–8.0)
PROTEIN: NEGATIVE mg/dL
Specific Gravity, Urine: 1.015 (ref 1.005–1.030)
UROBILINOGEN UA: 0.2 mg/dL (ref 0.0–1.0)

## 2013-07-15 NOTE — Progress Notes (Signed)
Patient doing well other than right sided back pain that originates in her lower back and radiates down her right leg making it painful to walk. Discussed comfort care with sciatic pain and the use of a maternity belt. CBGs elevated fasting and postprandial for 5/7 days. Last two days are within normal range. Patient admits to overeating and not following diet during those five days. Patient very resistant to insulin. Informed patient that if values remain abnormal, indulin therapy will be initiated as to decrease the risks of adverse outcomes. Patient verbalized understanding. Patient has appointment with peds urology on 07/30/2013  NST reviewed and reactive

## 2013-07-15 NOTE — Progress Notes (Signed)
P=89  Pt complains of continuous pain in rt leg for the past week.  Pt also reports pressure rt side, upper rt abd/flank, radiates downward to legs.

## 2013-07-19 ENCOUNTER — Encounter: Payer: Self-pay | Admitting: Obstetrics & Gynecology

## 2013-07-19 ENCOUNTER — Ambulatory Visit (INDEPENDENT_AMBULATORY_CARE_PROVIDER_SITE_OTHER): Payer: Medicaid Other | Admitting: General Practice

## 2013-07-19 ENCOUNTER — Ambulatory Visit (HOSPITAL_COMMUNITY)
Admission: RE | Admit: 2013-07-19 | Discharge: 2013-07-19 | Disposition: A | Discharge status: Home / Self Care | Payer: Medicaid Other | Source: Ambulatory Visit | Attending: Obstetrics & Gynecology | Admitting: Obstetrics & Gynecology

## 2013-07-19 VITALS — BP 102/58 | Wt 197.6 lb

## 2013-07-19 DIAGNOSIS — O24919 Unspecified diabetes mellitus in pregnancy, unspecified trimester: Secondary | ICD-10-CM | POA: Insufficient documentation

## 2013-07-19 DIAGNOSIS — Z363 Encounter for antenatal screening for malformations: Secondary | ICD-10-CM | POA: Insufficient documentation

## 2013-07-19 DIAGNOSIS — E119 Type 2 diabetes mellitus without complications: Secondary | ICD-10-CM

## 2013-07-19 DIAGNOSIS — Z1389 Encounter for screening for other disorder: Secondary | ICD-10-CM | POA: Insufficient documentation

## 2013-07-19 DIAGNOSIS — O24913 Unspecified diabetes mellitus in pregnancy, third trimester: Secondary | ICD-10-CM

## 2013-07-19 DIAGNOSIS — O358XX0 Maternal care for other (suspected) fetal abnormality and damage, not applicable or unspecified: Secondary | ICD-10-CM | POA: Insufficient documentation

## 2013-07-19 NOTE — Progress Notes (Signed)
NST performed today was reviewed and was found to be reactive.  Continue recommended antenatal testing and prenatal care.  

## 2013-07-19 NOTE — Progress Notes (Signed)
Pulse- 80 

## 2013-07-22 ENCOUNTER — Ambulatory Visit (INDEPENDENT_AMBULATORY_CARE_PROVIDER_SITE_OTHER): Payer: Medicaid Other | Admitting: Obstetrics and Gynecology

## 2013-07-22 ENCOUNTER — Encounter: Payer: Self-pay | Admitting: Obstetrics and Gynecology

## 2013-07-22 ENCOUNTER — Encounter: Payer: Self-pay | Admitting: Obstetrics & Gynecology

## 2013-07-22 VITALS — BP 99/68 | Temp 97.1°F

## 2013-07-22 DIAGNOSIS — O358XX0 Maternal care for other (suspected) fetal abnormality and damage, not applicable or unspecified: Secondary | ICD-10-CM

## 2013-07-22 DIAGNOSIS — N390 Urinary tract infection, site not specified: Secondary | ICD-10-CM

## 2013-07-22 DIAGNOSIS — O35EXX0 Maternal care for other (suspected) fetal abnormality and damage, fetal genitourinary anomalies, not applicable or unspecified: Secondary | ICD-10-CM

## 2013-07-22 DIAGNOSIS — O2340 Unspecified infection of urinary tract in pregnancy, unspecified trimester: Principal | ICD-10-CM

## 2013-07-22 DIAGNOSIS — O289 Unspecified abnormal findings on antenatal screening of mother: Secondary | ICD-10-CM

## 2013-07-22 DIAGNOSIS — O2390 Unspecified genitourinary tract infection in pregnancy, unspecified trimester: Secondary | ICD-10-CM

## 2013-07-22 DIAGNOSIS — O99210 Obesity complicating pregnancy, unspecified trimester: Secondary | ICD-10-CM

## 2013-07-22 DIAGNOSIS — E669 Obesity, unspecified: Secondary | ICD-10-CM

## 2013-07-22 DIAGNOSIS — O24919 Unspecified diabetes mellitus in pregnancy, unspecified trimester: Secondary | ICD-10-CM

## 2013-07-22 DIAGNOSIS — B951 Streptococcus, group B, as the cause of diseases classified elsewhere: Secondary | ICD-10-CM

## 2013-07-22 LAB — POCT URINALYSIS DIP (DEVICE)
Bilirubin Urine: NEGATIVE
GLUCOSE, UA: NEGATIVE mg/dL
HGB URINE DIPSTICK: NEGATIVE
Ketones, ur: 40 mg/dL — AB
NITRITE: NEGATIVE
PROTEIN: NEGATIVE mg/dL
Specific Gravity, Urine: 1.015 (ref 1.005–1.030)
UROBILINOGEN UA: 0.2 mg/dL (ref 0.0–1.0)
pH: 6.5 (ref 5.0–8.0)

## 2013-07-22 MED ORDER — GLYBURIDE 5 MG PO TABS: 10.0000 mg | ORAL_TABLET | Freq: Two times a day (BID) | ORAL | Status: DC

## 2013-07-22 MED ORDER — METFORMIN HCL 500 MG PO TABS: 1000.0000 mg | ORAL_TABLET | Freq: Two times a day (BID) | ORAL | Status: DC

## 2013-07-22 NOTE — Progress Notes (Signed)
Patient still complaining of right sciatic pain. Has been using maternity support belt which helps her. CBG highest fasting 128 (majority in 110's) highest pp 168 (most in 120's). Patient has not been taking anti glycemic medications for 1 week as she ran out. Informed patient not to wait in the future for refills, she can simply call for refills.  F/U growth ultrasound to be ordered as per [facility]  NST reviewed and reactive

## 2013-07-22 NOTE — Progress Notes (Signed)
P= 93 Pt. States she has not been taking Glyburide or metformin for the last week; needs refills on both. Also needs PNV refill.  Edema in ankles.  C/o of pelvic/vaginal pressure and right. C/o of pain from right lower back all the way down right leg to foot, describes it as feeling as "an electric shock," and states its hard to walk.

## 2013-07-26 ENCOUNTER — Encounter: Payer: Self-pay | Admitting: *Deleted

## 2013-07-26 ENCOUNTER — Ambulatory Visit (INDEPENDENT_AMBULATORY_CARE_PROVIDER_SITE_OTHER): Payer: Medicaid Other | Admitting: *Deleted

## 2013-07-26 ENCOUNTER — Ambulatory Visit (HOSPITAL_COMMUNITY)
Admission: RE | Admit: 2013-07-26 | Discharge: 2013-07-26 | Disposition: A | Discharge status: Home / Self Care | Payer: Medicaid Other | Source: Ambulatory Visit | Attending: Obstetrics & Gynecology | Admitting: Obstetrics & Gynecology

## 2013-07-26 VITALS — BP 104/63

## 2013-07-26 DIAGNOSIS — O24919 Unspecified diabetes mellitus in pregnancy, unspecified trimester: Secondary | ICD-10-CM

## 2013-07-26 DIAGNOSIS — Z363 Encounter for antenatal screening for malformations: Secondary | ICD-10-CM | POA: Insufficient documentation

## 2013-07-26 DIAGNOSIS — O289 Unspecified abnormal findings on antenatal screening of mother: Secondary | ICD-10-CM | POA: Insufficient documentation

## 2013-07-26 DIAGNOSIS — Z1389 Encounter for screening for other disorder: Secondary | ICD-10-CM | POA: Insufficient documentation

## 2013-07-26 DIAGNOSIS — O358XX0 Maternal care for other (suspected) fetal abnormality and damage, not applicable or unspecified: Secondary | ICD-10-CM | POA: Insufficient documentation

## 2013-07-26 NOTE — Addendum Note (Signed)
Addended by: Jill SideAY, DIANE L on: 07/26/2013 12:42 PM   Modules accepted: Orders

## 2013-07-26 NOTE — Progress Notes (Signed)
Few variables, reactive NST, BPP 8/8. RTC in 3 days

## 2013-07-26 NOTE — Progress Notes (Addendum)
P = 82   Pt sent to [facility] dept for BPP

## 2013-07-29 ENCOUNTER — Ambulatory Visit (INDEPENDENT_AMBULATORY_CARE_PROVIDER_SITE_OTHER): Payer: Medicaid Other | Admitting: Family Medicine

## 2013-07-29 ENCOUNTER — Encounter: Payer: Self-pay | Admitting: Obstetrics & Gynecology

## 2013-07-29 VITALS — BP 103/66 | Wt 199.3 lb

## 2013-07-29 DIAGNOSIS — O35EXX0 Maternal care for other (suspected) fetal abnormality and damage, fetal genitourinary anomalies, not applicable or unspecified: Secondary | ICD-10-CM

## 2013-07-29 DIAGNOSIS — O99210 Obesity complicating pregnancy, unspecified trimester: Secondary | ICD-10-CM

## 2013-07-29 DIAGNOSIS — O24919 Unspecified diabetes mellitus in pregnancy, unspecified trimester: Secondary | ICD-10-CM

## 2013-07-29 DIAGNOSIS — E669 Obesity, unspecified: Secondary | ICD-10-CM

## 2013-07-29 DIAGNOSIS — O358XX0 Maternal care for other (suspected) fetal abnormality and damage, not applicable or unspecified: Secondary | ICD-10-CM

## 2013-07-29 DIAGNOSIS — O289 Unspecified abnormal findings on antenatal screening of mother: Secondary | ICD-10-CM

## 2013-07-29 LAB — POCT URINALYSIS DIP (DEVICE)
BILIRUBIN URINE: NEGATIVE
GLUCOSE, UA: 250 mg/dL — AB
Ketones, ur: NEGATIVE mg/dL
Nitrite: NEGATIVE
PH: 6 (ref 5.0–8.0)
Protein, ur: NEGATIVE mg/dL
Specific Gravity, Urine: 1.01 (ref 1.005–1.030)
Urobilinogen, UA: 0.2 mg/dL (ref 0.0–1.0)

## 2013-07-29 NOTE — Progress Notes (Signed)
33 yo G3P2002 @ 35w2 here for NST - scheduled for US for growth on 1/30 - doing well  - reactive NST CBG highest fasting- all 81-90 (one value out of range at 124 but that was the day she had her meds refilled) CBG pp85-142 (2 values greater than 120s)    O: see flowsheet  A/p - cont twice weekly testing. NST today cat I tracing.  - f/u with physician next week - CBG at goal. Cont metformin and glyburide.   - need 36 week cx at next visit.

## 2013-07-29 NOTE — Patient Instructions (Signed)
Third Trimester of Pregnancy  The third trimester is from week 29 through week 42, months 7 through 9. The third trimester is a time when the fetus is growing rapidly. At the end of the ninth month, the fetus is about 20 inches in length and weighs 6 10 pounds.   BODY CHANGES  Your body goes through many changes during pregnancy. The changes vary from woman to woman.    Your weight will continue to increase. You can expect to gain 25 35 pounds (11 16 kg) by the end of the pregnancy.   You may begin to get stretch marks on your hips, abdomen, and breasts.   You may urinate more often because the fetus is moving lower into your pelvis and pressing on your bladder.   You may develop or continue to have heartburn as a result of your pregnancy.   You may develop constipation because certain hormones are causing the muscles that push waste through your intestines to slow down.   You may develop hemorrhoids or swollen, bulging veins (varicose veins).   You may have pelvic pain because of the weight gain and pregnancy hormones relaxing your joints between the bones in your pelvis. Back aches may result from over exertion of the muscles supporting your posture.   Your breasts will continue to grow and be tender. A yellow discharge may leak from your breasts called colostrum.   Your belly button may stick out.   You may feel short of breath because of your expanding uterus.   You may notice the fetus "dropping," or moving lower in your abdomen.   You may have a bloody mucus discharge. This usually occurs a few days to a week before labor begins.   Your cervix becomes thin and soft (effaced) near your due date.  WHAT TO EXPECT AT YOUR PRENATAL EXAMS   You will have prenatal exams every 2 weeks until week 36. Then, you will have weekly prenatal exams. During a routine prenatal visit:   You will be weighed to make sure you and the fetus are growing normally.   Your blood pressure is taken.   Your abdomen will be  measured to track your baby's growth.   The fetal heartbeat will be listened to.   Any test results from the previous visit will be discussed.   You may have a cervical check near your due date to see if you have effaced.  At around 36 weeks, your caregiver will check your cervix. At the same time, your caregiver will also perform a test on the secretions of the vaginal tissue. This test is to determine if a type of bacteria, Group B streptococcus, is present. Your caregiver will explain this further.  Your caregiver may ask you:   What your birth plan is.   How you are feeling.   If you are feeling the baby move.   If you have had any abnormal symptoms, such as leaking fluid, bleeding, severe headaches, or abdominal cramping.   If you have any questions.  Other tests or screenings that may be performed during your third trimester include:   Blood tests that check for low iron levels (anemia).   Fetal testing to check the health, activity level, and growth of the fetus. Testing is done if you have certain medical conditions or if there are problems during the pregnancy.  FALSE LABOR  You may feel small, irregular contractions that eventually go away. These are called Braxton Hicks contractions, or   false labor. Contractions may last for hours, days, or even weeks before true labor sets in. If contractions come at regular intervals, intensify, or become painful, it is best to be seen by your caregiver.   SIGNS OF LABOR    Menstrual-like cramps.   Contractions that are 5 minutes apart or less.   Contractions that start on the top of the uterus and spread down to the lower abdomen and back.   A sense of increased pelvic pressure or back pain.   A watery or bloody mucus discharge that comes from the vagina.  If you have any of these signs before the 37th week of pregnancy, call your caregiver right away. You need to go to the hospital to get checked immediately.  HOME CARE INSTRUCTIONS    Avoid all  smoking, herbs, alcohol, and unprescribed drugs. These chemicals affect the formation and growth of the baby.   Follow your caregiver's instructions regarding medicine use. There are medicines that are either safe or unsafe to take during pregnancy.   Exercise only as directed by your caregiver. Experiencing uterine cramps is a good sign to stop exercising.   Continue to eat regular, healthy meals.   Wear a good support bra for breast tenderness.   Do not use hot tubs, steam rooms, or saunas.   Wear your seat belt at all times when driving.   Avoid raw meat, uncooked cheese, cat litter boxes, and soil used by cats. These carry germs that can cause birth defects in the baby.   Take your prenatal vitamins.   Try taking a stool softener (if your caregiver approves) if you develop constipation. Eat more high-fiber foods, such as fresh vegetables or fruit and whole grains. Drink plenty of fluids to keep your urine clear or pale yellow.   Take warm sitz baths to soothe any pain or discomfort caused by hemorrhoids. Use hemorrhoid cream if your caregiver approves.   If you develop varicose veins, wear support hose. Elevate your feet for 15 minutes, 3 4 times a day. Limit salt in your diet.   Avoid heavy lifting, wear low heal shoes, and practice good posture.   Rest a lot with your legs elevated if you have leg cramps or low back pain.   Visit your dentist if you have not gone during your pregnancy. Use a soft toothbrush to brush your teeth and be gentle when you floss.   A sexual relationship may be continued unless your caregiver directs you otherwise.   Do not travel far distances unless it is absolutely necessary and only with the approval of your caregiver.   Take prenatal classes to understand, practice, and ask questions about the labor and delivery.   Make a trial run to the hospital.   Pack your hospital bag.   Prepare the baby's nursery.   Continue to go to all your prenatal visits as directed  by your caregiver.  SEEK MEDICAL CARE IF:   You are unsure if you are in labor or if your water has broken.   You have dizziness.   You have mild pelvic cramps, pelvic pressure, or nagging pain in your abdominal area.   You have persistent nausea, vomiting, or diarrhea.   You have a bad smelling vaginal discharge.   You have pain with urination.  SEEK IMMEDIATE MEDICAL CARE IF:    You have a fever.   You are leaking fluid from your vagina.   You have spotting or bleeding from your vagina.     You have severe abdominal cramping or pain.   You have rapid weight loss or gain.   You have shortness of breath with chest pain.   You notice sudden or extreme swelling of your face, hands, ankles, feet, or legs.   You have not felt your baby move in over an hour.   You have severe headaches that do not go away with medicine.   You have vision changes.  Document Released: 06/14/2001 Document Revised: 02/20/2013 Document Reviewed: 08/21/2012  [employer] Patient Information 2014 [employer].

## 2013-07-29 NOTE — Progress Notes (Signed)
P = 90      US for growth scheduled 08/02/13.  Pt continues to have pain in Rt leg.

## 2013-08-02 ENCOUNTER — Encounter: Payer: Self-pay | Admitting: Obstetrics & Gynecology

## 2013-08-02 ENCOUNTER — Ambulatory Visit (INDEPENDENT_AMBULATORY_CARE_PROVIDER_SITE_OTHER): Payer: Medicaid Other | Admitting: *Deleted

## 2013-08-02 ENCOUNTER — Ambulatory Visit (HOSPITAL_COMMUNITY)
Admission: RE | Admit: 2013-08-02 | Discharge: 2013-08-02 | Disposition: A | Discharge status: Home / Self Care | Payer: Medicaid Other | Source: Ambulatory Visit | Attending: Obstetrics and Gynecology | Admitting: Obstetrics and Gynecology

## 2013-08-02 VITALS — BP 110/73

## 2013-08-02 DIAGNOSIS — O358XX0 Maternal care for other (suspected) fetal abnormality and damage, not applicable or unspecified: Secondary | ICD-10-CM | POA: Insufficient documentation

## 2013-08-02 DIAGNOSIS — Z1389 Encounter for screening for other disorder: Secondary | ICD-10-CM | POA: Insufficient documentation

## 2013-08-02 DIAGNOSIS — O24919 Unspecified diabetes mellitus in pregnancy, unspecified trimester: Secondary | ICD-10-CM

## 2013-08-02 DIAGNOSIS — Z363 Encounter for antenatal screening for malformations: Secondary | ICD-10-CM | POA: Insufficient documentation

## 2013-08-02 NOTE — Progress Notes (Signed)
Reactive NST on 08/02/13

## 2013-08-02 NOTE — Progress Notes (Signed)
P = 85      US for growth done today

## 2013-08-03 ENCOUNTER — Encounter: Payer: Self-pay | Admitting: Obstetrics and Gynecology

## 2013-08-03 DIAGNOSIS — O3660X0 Maternal care for excessive fetal growth, unspecified trimester, not applicable or unspecified: Secondary | ICD-10-CM | POA: Insufficient documentation

## 2013-08-05 ENCOUNTER — Encounter: Payer: Self-pay | Admitting: *Deleted

## 2013-08-05 ENCOUNTER — Ambulatory Visit (INDEPENDENT_AMBULATORY_CARE_PROVIDER_SITE_OTHER): Payer: Medicaid Other | Admitting: Obstetrics & Gynecology

## 2013-08-05 VITALS — BP 110/66 | Temp 98.3°F | Wt 199.5 lb

## 2013-08-05 DIAGNOSIS — E669 Obesity, unspecified: Secondary | ICD-10-CM

## 2013-08-05 DIAGNOSIS — O99210 Obesity complicating pregnancy, unspecified trimester: Secondary | ICD-10-CM

## 2013-08-05 DIAGNOSIS — O24919 Unspecified diabetes mellitus in pregnancy, unspecified trimester: Secondary | ICD-10-CM

## 2013-08-05 LAB — FETAL NONSTRESS TEST

## 2013-08-05 LAB — POCT URINALYSIS DIP (DEVICE)
Bilirubin Urine: NEGATIVE
GLUCOSE, UA: 100 mg/dL — AB
Hgb urine dipstick: NEGATIVE
Nitrite: NEGATIVE
Protein, ur: NEGATIVE mg/dL
SPECIFIC GRAVITY, URINE: 1.02 (ref 1.005–1.030)
Urobilinogen, UA: 0.2 mg/dL (ref 0.0–1.0)
pH: 6 (ref 5.0–8.0)

## 2013-08-05 LAB — GLUCOSE, CAPILLARY: Glucose-Capillary: 82 mg/dL (ref 70–99)

## 2013-08-05 NOTE — Progress Notes (Signed)
US for growth done 1/30- EFW = 4373 gm.

## 2013-08-05 NOTE — Progress Notes (Signed)
Cultures today (no GBS--his GBS in urine).  Pt EFW 3675 at 35+ weeks.  Pt to have primary c/s for macrosomia.  Tracing is reactive today.  BPP last week with WNL.   CBG today = 82 fastings--80,108,99,77,85,107,80;  2 hr pp breakfast 121,145,143,89,100,114,128; 2 hr pp lunch 110,80,79,91,129,148,53, 2 hr pp lunch 175,145,131,139,98.  Pt to work on dinner.  If sill elevated, may have to switch to insulin before dinner.   Need update from peds urology.

## 2013-08-05 NOTE — Progress Notes (Signed)
P= 82 C/o of continuous right leg pain. Edema in feet.

## 2013-08-06 LAB — GC/CHLAMYDIA PROBE AMP
CT PROBE, AMP APTIMA: NEGATIVE
GC Probe RNA: NEGATIVE

## 2013-08-08 ENCOUNTER — Ambulatory Visit (INDEPENDENT_AMBULATORY_CARE_PROVIDER_SITE_OTHER): Payer: Medicaid Other | Admitting: *Deleted

## 2013-08-08 ENCOUNTER — Encounter: Payer: Self-pay | Admitting: Obstetrics & Gynecology

## 2013-08-08 VITALS — BP 110/64

## 2013-08-08 DIAGNOSIS — O24919 Unspecified diabetes mellitus in pregnancy, unspecified trimester: Secondary | ICD-10-CM

## 2013-08-08 LAB — US OB FOLLOW UP

## 2013-08-08 NOTE — Progress Notes (Signed)
P-101 

## 2013-08-10 ENCOUNTER — Inpatient Hospital Stay (HOSPITAL_COMMUNITY)
Admission: EM | Admit: 2013-08-10 | Discharge: 2013-08-17 | DRG: 765 | Disposition: A | Discharge status: Home / Self Care | Payer: Medicaid Other | Attending: Obstetrics & Gynecology | Admitting: Obstetrics & Gynecology

## 2013-08-10 ENCOUNTER — Encounter (HOSPITAL_COMMUNITY): Payer: Self-pay | Admitting: Emergency Medicine

## 2013-08-10 DIAGNOSIS — O9912 Other diseases of the blood and blood-forming organs and certain disorders involving the immune mechanism complicating childbirth: Secondary | ICD-10-CM

## 2013-08-10 DIAGNOSIS — O99892 Other specified diseases and conditions complicating childbirth: Secondary | ICD-10-CM | POA: Diagnosis present

## 2013-08-10 DIAGNOSIS — E119 Type 2 diabetes mellitus without complications: Secondary | ICD-10-CM | POA: Diagnosis present

## 2013-08-10 DIAGNOSIS — Z22330 Carrier of Group B streptococcus: Secondary | ICD-10-CM

## 2013-08-10 DIAGNOSIS — O24919 Unspecified diabetes mellitus in pregnancy, unspecified trimester: Secondary | ICD-10-CM

## 2013-08-10 DIAGNOSIS — I959 Hypotension, unspecified: Secondary | ICD-10-CM | POA: Diagnosis present

## 2013-08-10 DIAGNOSIS — O3660X0 Maternal care for excessive fetal growth, unspecified trimester, not applicable or unspecified: Secondary | ICD-10-CM | POA: Diagnosis present

## 2013-08-10 DIAGNOSIS — D689 Coagulation defect, unspecified: Secondary | ICD-10-CM | POA: Diagnosis present

## 2013-08-10 DIAGNOSIS — O212 Late vomiting of pregnancy: Secondary | ICD-10-CM | POA: Diagnosis present

## 2013-08-10 DIAGNOSIS — O359XX0 Maternal care for (suspected) fetal abnormality and damage, unspecified, not applicable or unspecified: Secondary | ICD-10-CM | POA: Diagnosis present

## 2013-08-10 DIAGNOSIS — O99214 Obesity complicating childbirth: Secondary | ICD-10-CM

## 2013-08-10 DIAGNOSIS — J45901 Unspecified asthma with (acute) exacerbation: Secondary | ICD-10-CM | POA: Diagnosis present

## 2013-08-10 DIAGNOSIS — R6889 Other general symptoms and signs: Secondary | ICD-10-CM | POA: Diagnosis present

## 2013-08-10 DIAGNOSIS — O2432 Unspecified pre-existing diabetes mellitus in childbirth: Secondary | ICD-10-CM | POA: Diagnosis present

## 2013-08-10 DIAGNOSIS — Z3490 Encounter for supervision of normal pregnancy, unspecified, unspecified trimester: Secondary | ICD-10-CM

## 2013-08-10 DIAGNOSIS — D696 Thrombocytopenia, unspecified: Secondary | ICD-10-CM | POA: Diagnosis present

## 2013-08-10 DIAGNOSIS — O9989 Other specified diseases and conditions complicating pregnancy, childbirth and the puerperium: Secondary | ICD-10-CM

## 2013-08-10 DIAGNOSIS — E669 Obesity, unspecified: Secondary | ICD-10-CM | POA: Diagnosis present

## 2013-08-10 DIAGNOSIS — J069 Acute upper respiratory infection, unspecified: Secondary | ICD-10-CM | POA: Diagnosis present

## 2013-08-10 LAB — INFLUENZA PANEL BY PCR (TYPE A & B)
H1N1FLUPCR: NOT DETECTED
Influenza A By PCR: NEGATIVE
Influenza B By PCR: NEGATIVE

## 2013-08-10 LAB — POCT I-STAT, CHEM 8
BUN: 5 mg/dL — AB (ref 6–23)
CHLORIDE: 105 meq/L (ref 96–112)
Calcium, Ion: 1.06 mmol/L — ABNORMAL LOW (ref 1.12–1.23)
Creatinine, Ser: 0.5 mg/dL (ref 0.50–1.10)
GLUCOSE: 138 mg/dL — AB (ref 70–99)
HEMATOCRIT: 34 % — AB (ref 36.0–46.0)
Hemoglobin: 11.6 g/dL — ABNORMAL LOW (ref 12.0–15.0)
Potassium: 3.4 mEq/L — ABNORMAL LOW (ref 3.7–5.3)
Sodium: 138 mEq/L (ref 137–147)
TCO2: 19 mmol/L (ref 0–100)

## 2013-08-10 LAB — CBC WITH DIFFERENTIAL/PLATELET
BASOS PCT: 0 % (ref 0–1)
Basophils Absolute: 0 10*3/uL (ref 0.0–0.1)
Eosinophils Absolute: 0 10*3/uL (ref 0.0–0.7)
Eosinophils Relative: 0 % (ref 0–5)
HEMATOCRIT: 33.2 % — AB (ref 36.0–46.0)
Hemoglobin: 11.3 g/dL — ABNORMAL LOW (ref 12.0–15.0)
Lymphocytes Relative: 12 % (ref 12–46)
Lymphs Abs: 1 10*3/uL (ref 0.7–4.0)
MCH: 27.6 pg (ref 26.0–34.0)
MCHC: 34 g/dL (ref 30.0–36.0)
MCV: 81 fL (ref 78.0–100.0)
Monocytes Absolute: 0.5 10*3/uL (ref 0.1–1.0)
Monocytes Relative: 6 % (ref 3–12)
NEUTROS ABS: 7 10*3/uL (ref 1.7–7.7)
Neutrophils Relative %: 82 % — ABNORMAL HIGH (ref 43–77)
Platelets: 128 10*3/uL — ABNORMAL LOW (ref 150–400)
RBC: 4.1 MIL/uL (ref 3.87–5.11)
RDW: 14.8 % (ref 11.5–15.5)
WBC: 8.6 10*3/uL (ref 4.0–10.5)

## 2013-08-10 LAB — LACTIC ACID, PLASMA: LACTIC ACID, VENOUS: 0.9 mmol/L (ref 0.5–2.2)

## 2013-08-10 LAB — GLUCOSE, CAPILLARY: GLUCOSE-CAPILLARY: 84 mg/dL (ref 70–99)

## 2013-08-10 LAB — OB RESULTS CONSOLE GBS: STREP GROUP B AG: POSITIVE

## 2013-08-10 MED ORDER — ONDANSETRON HCL 4 MG/2ML IJ SOLN
4.0000 mg | Freq: Once | INTRAMUSCULAR | Status: AC
  Administered 2013-08-10: 4 mg via INTRAVENOUS
  Filled 2013-08-10: qty 2

## 2013-08-10 MED ORDER — OSELTAMIVIR PHOSPHATE 75 MG PO CAPS
75.0000 mg | ORAL_CAPSULE | Freq: Once | ORAL | Status: AC
  Administered 2013-08-10: 75 mg via ORAL
  Filled 2013-08-10: qty 1

## 2013-08-10 MED ORDER — ZOLPIDEM TARTRATE 5 MG PO TABS: 5.0000 mg | ORAL_TABLET | Freq: Every evening | ORAL | Status: DC | PRN

## 2013-08-10 MED ORDER — SODIUM CHLORIDE 0.9 % IV BOLUS (SEPSIS)
1000.0000 mL | Freq: Once | INTRAVENOUS | Status: AC
  Administered 2013-08-10: 1000 mL via INTRAVENOUS

## 2013-08-10 MED ORDER — OXYCODONE-ACETAMINOPHEN 5-325 MG PO TABS
1.0000 | ORAL_TABLET | Freq: Four times a day (QID) | ORAL | Status: DC | PRN
Start: 2013-08-10 — End: 2013-08-14
  Administered 2013-08-10: 2 via ORAL
  Filled 2013-08-10: qty 2

## 2013-08-10 MED ORDER — ONDANSETRON HCL 4 MG/2ML IJ SOLN: 4.0000 mg | Freq: Three times a day (TID) | INTRAMUSCULAR | Status: DC | PRN

## 2013-08-10 MED ORDER — ACETAMINOPHEN 500 MG PO TABS: 500.0000 mg | ORAL_TABLET | Freq: Four times a day (QID) | ORAL | Status: DC | PRN

## 2013-08-10 MED ORDER — SODIUM CHLORIDE 0.9 % IV SOLN: INTRAVENOUS | Status: DC

## 2013-08-10 MED ORDER — DOCUSATE SODIUM 100 MG PO CAPS
100.0000 mg | ORAL_CAPSULE | Freq: Every day | ORAL | Status: DC
  Administered 2013-08-11 – 2013-08-13 (×3): 100 mg via ORAL
  Filled 2013-08-10 (×3): qty 1

## 2013-08-10 MED ORDER — OSELTAMIVIR PHOSPHATE 75 MG PO CAPS: 75.0000 mg | ORAL_CAPSULE | Freq: Two times a day (BID) | ORAL | Status: DC

## 2013-08-10 MED ORDER — ACETAMINOPHEN 325 MG PO TABS: 650.0000 mg | ORAL_TABLET | ORAL | Status: DC | PRN

## 2013-08-10 MED ORDER — OSELTAMIVIR PHOSPHATE 75 MG PO CAPS
75.0000 mg | ORAL_CAPSULE | Freq: Two times a day (BID) | ORAL | Status: DC
  Administered 2013-08-11 – 2013-08-13 (×6): 75 mg via ORAL
  Filled 2013-08-10 (×8): qty 1

## 2013-08-10 MED ORDER — CALCIUM CARBONATE ANTACID 500 MG PO CHEW
2.0000 | CHEWABLE_TABLET | ORAL | Status: DC | PRN
  Administered 2013-08-11: 400 mg via ORAL
  Filled 2013-08-10: qty 1
  Filled 2013-08-10: qty 2

## 2013-08-10 MED ORDER — ALBUTEROL SULFATE (2.5 MG/3ML) 0.083% IN NEBU
2.5000 mg | INHALATION_SOLUTION | RESPIRATORY_TRACT | Status: DC | PRN
  Administered 2013-08-11 (×3): 2.5 mg via RESPIRATORY_TRACT
  Filled 2013-08-10 (×4): qty 3

## 2013-08-10 MED ORDER — PRENATAL MULTIVITAMIN CH
1.0000 | ORAL_TABLET | Freq: Every day | ORAL | Status: DC
  Administered 2013-08-11 – 2013-08-13 (×3): 1 via ORAL
  Filled 2013-08-10 (×3): qty 1

## 2013-08-10 MED ORDER — GUAIFENESIN 100 MG/5ML PO SOLN
200.0000 mg | ORAL | Status: DC | PRN
  Administered 2013-08-12: 300 mg via ORAL
  Administered 2013-08-13 (×2): 200 mg via ORAL
  Filled 2013-08-10 (×4): qty 15
  Filled 2013-08-10: qty 10

## 2013-08-10 MED ORDER — INSULIN ASPART 100 UNIT/ML ~~LOC~~ SOLN
0.0000 [IU] | Freq: Three times a day (TID) | SUBCUTANEOUS | Status: DC
  Administered 2013-08-11 – 2013-08-12 (×2): 2 [IU] via SUBCUTANEOUS
  Administered 2013-08-12: 1 [IU] via SUBCUTANEOUS
  Administered 2013-08-12 – 2013-08-13 (×2): 3 [IU] via SUBCUTANEOUS
  Administered 2013-08-13: 4 [IU] via SUBCUTANEOUS
  Administered 2013-08-13: 3 [IU] via SUBCUTANEOUS
  Administered 2013-08-13: 100 [IU] via SUBCUTANEOUS

## 2013-08-10 MED ORDER — LACTATED RINGERS IV SOLN
INTRAVENOUS | Status: DC
  Administered 2013-08-10 – 2013-08-13 (×8): via INTRAVENOUS

## 2013-08-10 MED ORDER — ACETAMINOPHEN 325 MG PO TABS
650.0000 mg | ORAL_TABLET | Freq: Once | ORAL | Status: AC
  Administered 2013-08-10: 650 mg via ORAL
  Filled 2013-08-10: qty 2

## 2013-08-10 NOTE — Progress Notes (Signed)
[facility] nurse:  Called to see pt...[redacted] weeks gestation with c/o n/v and cough since yesterday. Reports other people in house sick too. Did not take temperature but states she feels like she has a fever. States she has been nauseated too and hasn't eaten since yesterday and only had ice chips today. Pt reports she is a gestational diabetic on oral meds and diet. FH 180's, no cramping or contractions reported by pt. Dr Debroah LoopArnold updated. Continue monitoring and report after IV hydration.

## 2013-08-10 NOTE — ED Provider Notes (Signed)
CSN: [id number]     Arrival date & time 08/10/13  1141 History   First MD Initiated Contact with Patient 08/10/13 1211     Chief Complaint  Patient presents with  . Cough  . Influenza   (Consider location/radiation/quality/duration/timing/severity/associated sxs/prior Treatment) HPI  33 year old female who is [redacted] weeks pregnant presents for flulike symptoms. Patient is a G3 P2. States that the son has been sick with the same symptoms ongoing for the past week. She develops symptoms since yesterday. Symptoms including productive cough, body aches, headaches, chills, nausea and vomiting, and sore throat. Complaining of abdominal pain only when she coughs. No specific treatment tried. Denies ear pain, nasal congestion, sneezing, back pain, dysuria, hematuria, vaginal bleeding, vaginal discharge, or contractions. States she does less movement from her baby today. She did receive her flu shot this year. Her OB/GYN is through [facility].  Past Medical History  Diagnosis Date  . Asthma   . Seasonal allergies   . Gestational diabetes    Past Surgical History  Procedure Laterality Date  . No past surgeries     History reviewed. No pertinent family history. History  Substance Use Topics  . Smoking status: Never Smoker   . Smokeless tobacco: Never Used  . Alcohol Use: No   OB History   Grav Para Term Preterm Abortions TAB SAB Ect Mult Living   3 2 2       2      Review of Systems  All other systems reviewed and are negative.    Allergies  Dust mite extract  Home Medications   Current Outpatient Rx  Name  Route  Sig  Dispense  Refill  . albuterol (PROVENTIL HFA;VENTOLIN HFA) 108 (90 BASE) MCG/ACT inhaler   Inhalation   Inhale 1-2 puffs into the lungs every 6 (six) hours as needed for wheezing.   1 Inhaler   1   . cyclobenzaprine (FLEXERIL) 10 MG tablet   Oral   Take 0.5-1 tablets (5-10 mg total) by mouth every 8 (eight) hours as needed for muscle spasms.   30 tablet    1   . glyBURIDE (DIABETA) 5 MG tablet   Oral   Take 2 tablets (10 mg total) by mouth 2 (two) times daily with a meal.   120 tablet   3   . metFORMIN (GLUCOPHAGE) 500 MG tablet   Oral   Take 2 tablets (1,000 mg total) by mouth 2 (two) times daily with a meal.   60 tablet   3   . pantoprazole (PROTONIX) 40 MG tablet   Oral   Take 1 tablet (40 mg total) by mouth daily.   30 tablet   3   . Prenatal Vit-Fe Fumarate-FA (PRENATAL MULTIVITAMIN) TABS tablet   Oral   Take 1 tablet by mouth daily at 12 noon.          BP 112/62  Pulse 96  Temp(Src) 97.8 F (36.6 C) (Oral)  Resp 18  SpO2 97%  LMP 11/18/2012 Physical Exam  Nursing note and vitals reviewed. Constitutional: She is oriented to person, place, and time. She appears well-developed and well-nourished. No distress (patient appears uncomfortable, laying on her side.).  HENT:  Head: Atraumatic.  Right Ear: External ear normal.  Left Ear: External ear normal.  Nose: Nose normal.  Mouth/Throat: Oropharynx is clear and moist. No oropharyngeal exudate.  Eyes: Conjunctivae are normal.  Neck: Neck supple. No JVD present.  Cardiovascular: Normal rate and regular rhythm.   Pulmonary/Chest: Effort  normal and breath sounds normal. No respiratory distress. She has no wheezes. She has no rales. She exhibits no tenderness.  Abdominal:  Abdomen is gravid, no focal point tenderness.  Lymphadenopathy:    She has no cervical adenopathy.  Neurological: She is alert and oriented to person, place, and time.  Skin: No rash noted.  Psychiatric: She has a normal mood and affect.    ED Course  Procedures (including critical care time)  12:28 PM Patient here with flulike symptoms. She is afebrile with stable normal vital signs. Her lungs are clear on exam. Symptoms likely viral in origin as her kids are sick with the same symptoms. She is [redacted] weeks pregnant, carotid OB nurse is available at bedside to perform evaluation of her pregnancy.  Will give Tamiflu, IV fluid, Tylenol, and Zofran. We'll continue to monitor. CAre discussed with attending.    12:40 PM OB nurse report that fetal heart rates in 180s, however abd soft, no contraction, no vaginal bleeding.  Will care for pt with IVF and monitor.    2:55 PM Fetal heart rates improved to 160 after IV hydration. Patient's sats at 98% on room air with ambulation. Plan to discharge patient with Tamiflu, and close followup with OB/GYN for further management. Patient encouraged for continued hydration at home.  4:05 PM Prior to d/c, pt is hypotensive with BP in the 80s systolic.  Plan to consult OBGYN for admission for obs and hydration.    4:15 PM i have consulted with OB Dr. Scheryl DarterJames Arnold who agrees to admit pt to anten[facility] for further care.  Pt agrees with plan.    Labs Review Labs Reviewed  INFLUENZA PANEL BY PCR (TYPE A & B, H1N1)  CBC WITH DIFFERENTIAL   Imaging Review No results found.  EKG Interpretation   None       MDM   1. Flu-like symptoms   2. Pregnancy    BP 113/57  Pulse 106  Temp(Src) 97.8 F (36.6 C) (Oral)  Resp 18  SpO2 96%  LMP 11/18/2012      Fayrene HelperBowie Monaca Wadas, PA-C 08/10/13 1521  Fayrene HelperBowie Aminah Zabawa, PA-C 08/10/13 1616

## 2013-08-10 NOTE — ED Provider Notes (Signed)
Medical screening examination/treatment/procedure(s) were conducted as a shared visit with non-physician practitioner(s) and myself.  I personally evaluated the patient during the encounter.    Patient here with flu-like symptoms. [redacted] weeks pregnant. No fevers. FHR initially in 180s, likely due to dehydration, fluids given. Toco normal, no contractions. FHR improving after fluids, however BPs trending down, systolics in the 80s. Hypoxic at rest around 92%. Does well when ambulating. [facility] observation.   Dagmar HaitWilliam Traeton Bordas, MD 08/10/13 2005

## 2013-08-10 NOTE — ED Notes (Signed)
Not[facility]

## 2013-08-10 NOTE — ED Notes (Signed)
EDP Allen KellWalden and Bowie, PA notified of pt's VS.  BP and SPO2 lower and HR higher.

## 2013-08-10 NOTE — H&P (Signed)
Laurie Hess is a 33 y.o. female G58P2002 with IUP at [redacted]w[redacted]d w/ hx of asthma was transferred from [facility] for flu like symptoms associated with hypotension. Pt has 2 children with similar symptoms. Pt reports fevers/chills, productive cough, nausea/vomiting, general myalgias and malaise. Pt denies congestions, sneezing, headache. Endorses fetal movement, no LOF, no vb, and occasional ctx  [facility] 12 wks  Prenatal History/Complications: GDMA2 on glyburide 10mg  BID/metformin 1000mg  BID with fetal macrosomia and planned primary c-section. GBS+ thrombocytopena 128  Past Medical History: Past Medical History  Diagnosis Date  . Asthma   . Seasonal allergies   . Gestational diabetes     Past Surgical History: Past Surgical History  Procedure Laterality Date  . No past surgeries      Obstetrical History: OB History   Grav Para Term Preterm Abortions TAB SAB Ect Mult Living   3 2 2       2       Gynecological History: OB History   Grav Para Term Preterm Abortions TAB SAB Ect Mult Living   3 2 2       2       Social History: History   Social History  . Marital Status: Married    Spouse Name: N/A    Number of Children: N/A  . Years of Education: N/A   Social History Main Topics  . Smoking status: Never Smoker   . Smokeless tobacco: Never Used  . Alcohol Use: No  . Drug Use: No  . Sexual Activity: Yes    Birth Control/ Protection: None   Other Topics Concern  . None   Social History Narrative  . None    Family History: History reviewed. No pertinent family history.  Allergies: Allergies  Allergen Reactions  . Dust Mite Extract Itching    Prescriptions prior to admission  Medication Sig Dispense Refill  . albuterol (PROVENTIL HFA;VENTOLIN HFA) 108 (90 BASE) MCG/ACT inhaler Inhale 1-2 puffs into the lungs every 6 (six) hours as needed for wheezing.  1 Inhaler  1  . cyclobenzaprine (FLEXERIL) 10 MG tablet Take 0.5-1 tablets (5-10 mg total) by mouth every  8 (eight) hours as needed for muscle spasms.  30 tablet  1  . glyBURIDE (DIABETA) 5 MG tablet Take 2 tablets (10 mg total) by mouth 2 (two) times daily with a meal.  120 tablet  3  . metFORMIN (GLUCOPHAGE) 500 MG tablet Take 2 tablets (1,000 mg total) by mouth 2 (two) times daily with a meal.  60 tablet  3  . pantoprazole (PROTONIX) 40 MG tablet Take 1 tablet (40 mg total) by mouth daily.  30 tablet  3  . Prenatal Vit-Fe Fumarate-FA (PRENATAL MULTIVITAMIN) TABS tablet Take 1 tablet by mouth daily at 12 noon.         Review of Systems    Blood pressure 89/52, pulse 111, temperature 100.3 F (37.9 C), temperature source Axillary, resp. rate 22, last menstrual period 11/18/2012, SpO2 97.00%. General appearance: cooperative, appears older than stated age, fatigued, flushed, mild distress, mildly obese and pale Lungs: clear but with upper lung sounds and mild rhonchi diffusely Heart: regular rate and rhythm Abdomen: soft, mildly-tender; without rebound. nontender uterus Extremities: Homans sign is negative, no sign of DVT Fetal monitoringBaseline: 150-160s bpm, Variability: Good {> 6 bpm), Accelerations: Reactive and Decelerations: Absent Uterine activity 6-24min     Prenatal labs: ABO, Rh: O/POS/-- (08/19 1610) Antibody: NEG (08/19 0909) Rubella:   RPR: NON REAC (11/24 1021)  HBsAg: NEGATIVE (08/19 0909)  HIV: NON REACTIVE (11/24 1021)  GBS: Positive (02/07 0000)   Clinic  High Risk  Dating LMP/Ultrasound:  9 weeks        Ultrasound consistent with LMP: Yes  Genetic Screen  Quad:       Negative   Anatomic [place]  right hydronephrosis/pyelectasis and left pyelectasis--female fetus--otherwise normal  GTT Early: 207           TDaP vaccine 05/27/13  Flu vaccine 04/08/2013  GBS  Positive urine  Baby Food  Breast  Contraception  Mirena IUD  Circumcision  Declines  Pediatrician  [facility]     Prenatal Transfer Tool  Maternal Diabetes: Yes:  Diabetes Type:  Insulin/Medication  controlled Genetic Screening: Normal Maternal Ultrasounds/Referrals: Abnormal:  Findings:   Fetal renal pyelectasis Fetal Ultrasounds or other Referrals:  None Maternal Substance Abuse:  No Significant Maternal Medications:  Meds include: Other: tamiflu, metformin/glyburide, albuterol Significant Maternal Lab Results: Lab values include: Group B Strep positive     Results for orders placed during the hospital encounter of 08/10/13 (from the past 24 hour(s))  OB RESULTS CONSOLE GBS   Collection Time    08/10/13 12:00 AM      Result Value Range   GBS Positive    CBC WITH DIFFERENTIAL   Collection Time    08/10/13  4:45 PM      Result Value Range   WBC 8.6  4.0 - 10.5 K/uL   RBC 4.10  3.87 - 5.11 MIL/uL   Hemoglobin 11.3 (*) 12.0 - 15.0 g/dL   HCT 40.9 (*) 81.1 - 91.4 %   MCV 81.0  78.0 - 100.0 fL   MCH 27.6  26.0 - 34.0 pg   MCHC 34.0  30.0 - 36.0 g/dL   RDW 78.2  95.6 - 21.3 %   Platelets 128 (*) 150 - 400 K/uL   Neutrophils Relative % 82 (*) 43 - 77 %   Neutro Abs 7.0  1.7 - 7.7 K/uL   Lymphocytes Relative 12  12 - 46 %   Lymphs Abs 1.0  0.7 - 4.0 K/uL   Monocytes Relative 6  3 - 12 %   Monocytes Absolute 0.5  0.1 - 1.0 K/uL   Eosinophils Relative 0  0 - 5 %   Eosinophils Absolute 0.0  0.0 - 0.7 K/uL   Basophils Relative 0  0 - 1 %   Basophils Absolute 0.0  0.0 - 0.1 K/uL  POCT I-STAT, CHEM 8   Collection Time    08/10/13  5:04 PM      Result Value Range   Sodium 138  137 - 147 mEq/L   Potassium 3.4 (*) 3.7 - 5.3 mEq/L   Chloride 105  96 - 112 mEq/L   BUN 5 (*) 6 - 23 mg/dL   Creatinine, Ser 0.86  0.50 - 1.10 mg/dL   Glucose, Bld 578 (*) 70 - 99 mg/dL   Calcium, Ion 4.69 (*) 1.12 - 1.23 mmol/L   TCO2 19  0 - 100 mmol/L   Hemoglobin 11.6 (*) 12.0 - 15.0 g/dL   HCT 62.9 (*) 52.8 - 41.3 %    Assessment: Laurie Hess is a 33 y.o. G3P2002 at [redacted]w[redacted]d by L=9 here for admission related to flu like illness  #Flu like illness: Pt appears to be moderately ill with low  blood pressures, fever, normal WBC, tachycardia and tachypnea.  Will draw a lactic acid to evaluate for severe sepsis. Source likely  the flu and will treat with tamiflu. Close observation given hx of asthma and pregnancy puts at increased risk for acute respiratory distress syndrome. - lactic acid - q2 vitals - albuterol PRN q4hr - tamiflu 75mg  BID x5 days or longer if needed - pending flu cultures, will continue tx - Percocet PRN for pain or fever  #Diabetes: Because of decreased intake while ill, will hold metformin and glyburide with plan to tx with SSI low dose.  - low dose SSI - Qac, QHS FSG - adjust regimen PRN, return to baseline regimen once tolerating diet  #asthma: Currently not wheezing. Will give albuterol PRN q4hr and incentive spirometer.  #Labor: plans for c-section at 39 wks for fetal macrosomia  #FWB:  Reactive and reassuring, qshift NST  FEN: LR 120cc/hr / Replete PRN / Carb controlled diet  Dispo: pending stablization  Full Code  Pt seen with Dr. Debroah LoopArnold and plan developed in collaboration.  Tawana ScaleODOM, Orvan Papadakis RYAN 08/10/2013, 7:21 PM

## 2013-08-10 NOTE — Progress Notes (Signed)
OB rapid response: FH improved with hydration. No c/o uterine cramping or contractions. Dr Debroah LoopArnold updated and has cleared the pt. Reviewed signs of labor with pt. Pt has schedule appointment this Monday 2/9 she was instructed to keep.

## 2013-08-10 NOTE — ED Notes (Signed)
Pt ambulated to restroom and back.  O2 sats remained 96% or higher.  Pt used restroom and then taken back to room.  Pt in NAD.

## 2013-08-10 NOTE — MAU Note (Signed)
Pt here via [facility] for low bp's and flu like s/s. Pt's two sons have been +flu.

## 2013-08-10 NOTE — ED Notes (Signed)
Notified OB Rapid Reponse 

## 2013-08-10 NOTE — ED Notes (Signed)
Pt reports being approx [redacted] weeks pregnant. Having flu like symptoms since yesterday, having productive cough, bodyaches, headache, chills, n/v. Mask on pt at triage.pt reports only abd pain when she coughs, denies back pain, vaginal discharge or bleeding.

## 2013-08-11 LAB — GLUCOSE, CAPILLARY
GLUCOSE-CAPILLARY: 136 mg/dL — AB (ref 70–99)
Glucose-Capillary: 83 mg/dL (ref 70–99)
Glucose-Capillary: 160 mg/dL — ABNORMAL HIGH (ref 70–99)

## 2013-08-11 LAB — URINALYSIS, ROUTINE W REFLEX MICROSCOPIC
Bilirubin Urine: NEGATIVE
GLUCOSE, UA: NEGATIVE mg/dL
Hgb urine dipstick: NEGATIVE
Leukocytes, UA: NEGATIVE
Nitrite: NEGATIVE
Protein, ur: NEGATIVE mg/dL
Specific Gravity, Urine: <1.005 — ABNORMAL LOW (ref 1.005–1.030)
Urobilinogen, UA: 0.2 mg/dL (ref 0.0–1.0)
pH: 6 (ref 5.0–8.0)

## 2013-08-11 MED ORDER — IPRATROPIUM BROMIDE 0.02 % IN SOLN
0.5000 mg | RESPIRATORY_TRACT | Status: DC | PRN
  Administered 2013-08-11 (×2): 0.5 mg via RESPIRATORY_TRACT
  Filled 2013-08-11 (×2): qty 2.5

## 2013-08-11 NOTE — Progress Notes (Signed)
Patient ID: Joellen JerseyMuna Clanton, female   DOB: 08/19/1980, 33 y.o.   MRN: [id number] FACULTY PRACTICE ANTEPARTUM(COMPREHENSIVE) NOTE  Joellen JerseyMuna Vignola is a 33 y.o. G3P2002 at 6820w1d by best clinical estimate who is admitted for flu-like symptoms.   Fetal presentation is cephalic. Length of Stay:  1  Days  Subjective: Breathing easier, some wheezes Patient reports the fetal movement as active. Patient reports uterine contraction  activity as none. Patient reports  vaginal bleeding as none. Patient describes fluid per vagina as None.  Vitals:  Blood pressure 105/61, pulse 90, temperature 98.4 F (36.9 C), temperature source Oral, resp. rate 20, height 5\' 2"  (1.575 m), weight 199 lb (90.266 kg), last menstrual period 11/18/2012, SpO2 97.00%. Physical Examination:  General appearance - overweight, improved, well hydrated and no respiratory distress, mild cough. Chest-scattered wheezes, expiratory Heart - normal rate and regular rhythm Abdomen - soft, nontender, nondistended Fundal Height:  size equals dates Cervical Exam: Not evaluated. Extremities: extremities normal, atraumatic, no cyanosis or edema Membranes:intact  Fetal Monitoring:  Baseline: 150 bpm  Labs:  Results for orders placed during the hospital encounter of 08/10/13 (from the past 24 hour(s))  INFLUENZA PANEL BY PCR (TYPE A & B, H1N1)   Collection Time    08/10/13  4:05 PM      Result Value Range   Influenza A By PCR NEGATIVE  NEGATIVE   Influenza B By PCR NEGATIVE  NEGATIVE   H1N1 flu by pcr NOT DETECTED  NOT DETECTED  CBC WITH DIFFERENTIAL   Collection Time    08/10/13  4:45 PM      Result Value Range   WBC 8.6  4.0 - 10.5 K/uL   RBC 4.10  3.87 - 5.11 MIL/uL   Hemoglobin 11.3 (*) 12.0 - 15.0 g/dL   HCT 40.933.2 (*) 81.136.0 - 91.446.0 %   MCV 81.0  78.0 - 100.0 fL   MCH 27.6  26.0 - 34.0 pg   MCHC 34.0  30.0 - 36.0 g/dL   RDW 78.214.8  95.611.5 - 21.315.5 %   Platelets 128 (*) 150 - 400 K/uL   Neutrophils Relative % 82 (*) 43 - 77 %   Neutro  Abs 7.0  1.7 - 7.7 K/uL   Lymphocytes Relative 12  12 - 46 %   Lymphs Abs 1.0  0.7 - 4.0 K/uL   Monocytes Relative 6  3 - 12 %   Monocytes Absolute 0.5  0.1 - 1.0 K/uL   Eosinophils Relative 0  0 - 5 %   Eosinophils Absolute 0.0  0.0 - 0.7 K/uL   Basophils Relative 0  0 - 1 %   Basophils Absolute 0.0  0.0 - 0.1 K/uL  POCT I-STAT, CHEM 8   Collection Time    08/10/13  5:04 PM      Result Value Range   Sodium 138  137 - 147 mEq/L   Potassium 3.4 (*) 3.7 - 5.3 mEq/L   Chloride 105  96 - 112 mEq/L   BUN 5 (*) 6 - 23 mg/dL   Creatinine, Ser 0.860.50  0.50 - 1.10 mg/dL   Glucose, Bld 578138 (*) 70 - 99 mg/dL   Calcium, Ion 4.691.06 (*) 1.12 - 1.23 mmol/L   TCO2 19  0 - 100 mmol/L   Hemoglobin 11.6 (*) 12.0 - 15.0 g/dL   HCT 62.934.0 (*) 52.836.0 - 41.346.0 %  LACTIC ACID, PLASMA   Collection Time    08/10/13  7:40 PM      Result Value  Range   Lactic Acid, Venous 0.9  0.5 - 2.2 mmol/L  GLUCOSE, CAPILLARY   Collection Time    08/10/13 10:05 PM      Result Value Range   Glucose-Capillary 84  70 - 99 mg/dL  URINALYSIS, ROUTINE W REFLEX MICROSCOPIC   Collection Time    08/11/13  1:43 AM      Result Value Range   Color, Urine YELLOW  YELLOW   APPearance CLEAR  CLEAR   Specific Gravity, Urine <1.005 (*) 1.005 - 1.030   pH 6.0  5.0 - 8.0   Glucose, UA NEGATIVE  NEGATIVE mg/dL   Hgb urine dipstick NEGATIVE  NEGATIVE   Bilirubin Urine NEGATIVE  NEGATIVE   Ketones, ur >80 (*) NEGATIVE mg/dL   Protein, ur NEGATIVE  NEGATIVE mg/dL   Urobilinogen, UA 0.2  0.0 - 1.0 mg/dL   Nitrite NEGATIVE  NEGATIVE   Leukocytes, UA NEGATIVE  NEGATIVE   CBG (last 3)   Recent Labs  08/10/13 2205  GLUCAP 84     Imaging Studies:      Currently EPIC will not allow sonographic studies to automatically populate into notes.  In the meantime, copy and paste results into note or free text.  Medications:  Scheduled . docusate sodium  100 mg Oral Daily  . insulin aspart  0-14 Units Subcutaneous TID WC  . oseltamivir  75  mg Oral BID  . prenatal multivitamin  1 tablet Oral Q1200   I have reviewed the patient's current medications.  ASSESSMENT: Patient Active Problem List   Diagnosis Date Noted  . Flu-like symptoms 08/10/2013  . LGA (large for gestational age) fetus affecting mother, antepartum 08/03/2013  . GBS (group B streptococcus) UTI complicating pregnancy 05/20/2013  . Thrombocytopenia complicating pregnancy 04/29/2013  . Bilateral Pyelectasis of fetus on prenatal ultrasound 04/10/2013  . Diabetes mellitus, Class A2/B, antepartum 03/22/2013  . Obesity complicating pregnancy, childbirth, or the puerperium, antepartum condition or complication(649.13) 02/19/2013  Negative flu test  PLAN: Continue to observe, give albuterol Tx  Tysheem Accardo 08/11/2013,7:49 AM

## 2013-08-12 ENCOUNTER — Other Ambulatory Visit: Payer: Medicaid Other

## 2013-08-12 ENCOUNTER — Encounter (HOSPITAL_COMMUNITY): Payer: Self-pay | Admitting: Pharmacist

## 2013-08-12 LAB — GLUCOSE, CAPILLARY
Glucose-Capillary: 114 mg/dL — ABNORMAL HIGH (ref 70–99)
Glucose-Capillary: 131 mg/dL — ABNORMAL HIGH (ref 70–99)
Glucose-Capillary: 193 mg/dL — ABNORMAL HIGH (ref 70–99)
Glucose-Capillary: 211 mg/dL — ABNORMAL HIGH (ref 70–99)

## 2013-08-12 MED ORDER — FAMOTIDINE 20 MG PO TABS
10.0000 mg | ORAL_TABLET | Freq: Every day | ORAL | Status: DC | PRN
  Administered 2013-08-12 – 2013-08-13 (×3): 10 mg via ORAL
  Filled 2013-08-12 (×4): qty 1

## 2013-08-12 MED ORDER — DEXTROSE 5 % IV SOLN
1.0000 g | Freq: Two times a day (BID) | INTRAVENOUS | Status: DC
  Administered 2013-08-12 – 2013-08-13 (×4): 1 g via INTRAVENOUS
  Filled 2013-08-12 (×5): qty 10

## 2013-08-12 MED ORDER — IPRATROPIUM BROMIDE 0.02 % IN SOLN
0.5000 mg | RESPIRATORY_TRACT | Status: DC
  Administered 2013-08-12 – 2013-08-13 (×8): 0.5 mg via RESPIRATORY_TRACT
  Filled 2013-08-12 (×15): qty 2.5

## 2013-08-12 MED ORDER — METHYLPREDNISOLONE SODIUM SUCC 125 MG IJ SOLR
125.0000 mg | Freq: Four times a day (QID) | INTRAMUSCULAR | Status: DC
  Administered 2013-08-12 – 2013-08-13 (×6): 125 mg via INTRAVENOUS
  Filled 2013-08-12 (×10): qty 2

## 2013-08-12 MED ORDER — ALBUTEROL SULFATE (2.5 MG/3ML) 0.083% IN NEBU
2.5000 mg | INHALATION_SOLUTION | RESPIRATORY_TRACT | Status: DC
  Administered 2013-08-12 – 2013-08-13 (×8): 2.5 mg via RESPIRATORY_TRACT
  Filled 2013-08-12: qty 3

## 2013-08-12 MED ORDER — AZITHROMYCIN 500 MG PO TABS
500.0000 mg | ORAL_TABLET | Freq: Every day | ORAL | Status: DC
  Administered 2013-08-13: 500 mg via ORAL
  Filled 2013-08-12 (×2): qty 1

## 2013-08-12 MED ORDER — DEXTROSE 5 % IV SOLN
500.0000 mg | Freq: Once | INTRAVENOUS | Status: AC
  Administered 2013-08-12: 500 mg via INTRAVENOUS
  Filled 2013-08-12: qty 500

## 2013-08-12 NOTE — Progress Notes (Signed)
Antenatal Nutrition Assessment:  Currently  37 2/[redacted] weeks gestation, with flu symptoms, GDM. Height  62 "  Weight 199 lbs  pre-pregnancy weight 183 lbs . Weight at initial prenatal visit 173 lbs  Pre-pregnancy  BMI 33.5  IBW 110 lbs Total net weight gain 16.lbs Weight gain goals 11-20 lbs Estimated needs: 17-1900 kcal/day, 64-74 grams protein/day, 2 liters fluid/day  CHO modified gestational diet  Current diet prescription will provide for increased needs.  Nutrition related labs  CBG (last 3)   Recent Labs  08/11/13 2327 08/12/13 0756 08/12/13 1155  GLUCAP 136* 114* 131*      Nutrition Dx: Increased nutrient needs r/t pregnancy and fetal growth requirements aeb [redacted] weeks gestation.  No educational needs assessed at this time. [facility] and has been educated by RD this pregnancy  Laurie Hess M.Odis LusterEd. R.D. LDN Neonatal Nutrition Support Specialist Pager [phone]

## 2013-08-12 NOTE — Progress Notes (Signed)
FACULTY PRACTICE ANTEPARTUM(COMPREHENSIVE) NOTE  Laurie Hess is a 33 y.o. [id number] with Estimated Date of Delivery: 08/31/13   By  early ultrasound 6351w2d  who is admitted for viral illness and asthma exaserbation Class A2 diabetes, fetal macrosomia for primary Caesarean section at 39 weeks Admitted with low grade fever suspicious for influenza but PCR negative and WBC normal and no further fever. Is having an asthma exacerbation with course rhonchi no consolidation and expiratory wheezes Pt is not "working" at this point but is a bit tight Had breathing treatments all day yesterday with some minimal improvement and had 2 large mucous plugs expectorated .    Fetal presentation is cephalic. Length of Stay:  2  Days  Date of admission:08/10/2013  Subjective: As above Patient reports the fetal movement as active. Patient reports uterine contraction  activity as none. Patient reports  vaginal bleeding as none. Patient describes fluid per vagina as None.  Vitals:  Blood pressure 105/65, pulse 81, temperature 98.2 F (36.8 C), temperature source Oral, resp. rate 24, height 5\' 2"  (1.575 m), weight 199 lb (90.266 kg), last menstrual period 11/18/2012, SpO2 100.00%. Filed Vitals:   08/12/13 1207 08/12/13 1240 08/12/13 1345 08/12/13 1357  BP: 105/65     Pulse: 79 81 81   Temp: 98.2 F (36.8 C)     TempSrc: Oral     Resp: 24     Height:      Weight:      SpO2:  98% 98% 100%   Physical Examination:  General appearance - alert, well appearing, and in no distress Chest - wheezing noted coarse rhochi good air movement,  Abdomen -  benign Fundal Height:  size greater than dates Extremities: extremities normal, atraumatic, no cyanosis or edema with DTRs 2+ bilaterally Membranes:intact  Fetal Monitoring:  Baseline: 145 bpm, Variability: Good {> 6 bpm) and Accelerations: Reactive   reactive  Labs:  Results for orders placed during the hospital encounter of 08/10/13 (from the past 24 hour(s))   GLUCOSE, CAPILLARY   Collection Time    08/11/13  9:27 PM      Result Value Range   Glucose-Capillary 160 (*) 70 - 99 mg/dL  GLUCOSE, CAPILLARY   Collection Time    08/11/13 11:27 PM      Result Value Range   Glucose-Capillary 136 (*) 70 - 99 mg/dL  GLUCOSE, CAPILLARY   Collection Time    08/12/13  7:56 AM      Result Value Range   Glucose-Capillary 114 (*) 70 - 99 mg/dL  GLUCOSE, CAPILLARY   Collection Time    08/12/13 11:55 AM      Result Value Range   Glucose-Capillary 131 (*) 70 - 99 mg/dL      Medications:  Scheduled . albuterol  2.5 mg Nebulization Q4H  . docusate sodium  100 mg Oral Daily  . insulin aspart  0-14 Units Subcutaneous TID WC  . ipratropium  0.5 mg Nebulization Q4H  . methylPREDNISolone (SOLU-MEDROL) injection  125 mg Intravenous Q6H  . oseltamivir  75 mg Oral BID  . prenatal multivitamin  1 tablet Oral Q1200   I have reviewed the patient's current medications.  ASSESSMENT: Asthma exacerbation possibly from a viral illness Unlikely Community acquired pneumonia but will cover with late gestation  Patient Active Problem List   Diagnosis Date Noted  . Flu-like symptoms 08/10/2013  . LGA (large for gestational age) fetus affecting mother, antepartum 08/03/2013  . GBS (group B streptococcus) UTI complicating pregnancy 05/20/2013  .  Thrombocytopenia complicating pregnancy 04/29/2013  . Bilateral Pyelectasis of fetus on prenatal ultrasound 04/10/2013  . Diabetes mellitus, Class A2/B, antepartum 03/22/2013  . Obesity complicating pregnancy, childbirth, or the puerperium, antepartum condition or complication(649.13) 02/19/2013    PLAN: Tried to hold off using steroids due to A2 diabetes and possibility of influenza but after exam this am will give IV solumedrol Also will cover empirically for CA pneumonia with late gestation and the elevted complication risk in the late gestation and asthma exacerbation   EURE,LUTHER H

## 2013-08-12 NOTE — Progress Notes (Signed)
Ur chart review completed.  

## 2013-08-13 ENCOUNTER — Inpatient Hospital Stay (HOSPITAL_COMMUNITY): Payer: Medicaid Other

## 2013-08-13 DIAGNOSIS — R6889 Other general symptoms and signs: Secondary | ICD-10-CM

## 2013-08-13 DIAGNOSIS — O3660X0 Maternal care for excessive fetal growth, unspecified trimester, not applicable or unspecified: Secondary | ICD-10-CM

## 2013-08-13 DIAGNOSIS — O24919 Unspecified diabetes mellitus in pregnancy, unspecified trimester: Secondary | ICD-10-CM

## 2013-08-13 LAB — GLUCOSE, CAPILLARY
GLUCOSE-CAPILLARY: 193 mg/dL — AB (ref 70–99)
GLUCOSE-CAPILLARY: 229 mg/dL — AB (ref 70–99)
Glucose-Capillary: 193 mg/dL — ABNORMAL HIGH (ref 70–99)
Glucose-Capillary: 218 mg/dL — ABNORMAL HIGH (ref 70–99)

## 2013-08-13 MED ORDER — IPRATROPIUM-ALBUTEROL 0.5-2.5 (3) MG/3ML IN SOLN
3.0000 mL | RESPIRATORY_TRACT | Status: DC | PRN
  Filled 2013-08-13: qty 3

## 2013-08-13 MED ORDER — SODIUM CHLORIDE 0.9 % IJ SOLN: 3.0000 mL | INTRAMUSCULAR | Status: DC | PRN

## 2013-08-13 MED ORDER — INSULIN ASPART 100 UNIT/ML ~~LOC~~ SOLN
4.0000 [IU] | Freq: Once | SUBCUTANEOUS | Status: AC
  Administered 2013-08-14: 4 [IU] via SUBCUTANEOUS

## 2013-08-13 MED ORDER — INSULIN NPH (HUMAN) (ISOPHANE) 100 UNIT/ML ~~LOC~~ SUSP
10.0000 [IU] | Freq: Once | SUBCUTANEOUS | Status: AC
  Administered 2013-08-13: 10 [IU] via SUBCUTANEOUS
  Filled 2013-08-13: qty 10

## 2013-08-13 MED ORDER — IPRATROPIUM-ALBUTEROL 0.5-2.5 (3) MG/3ML IN SOLN
3.0000 mL | RESPIRATORY_TRACT | Status: DC
  Administered 2013-08-13: 3 mL via RESPIRATORY_TRACT
  Filled 2013-08-13 (×6): qty 3

## 2013-08-13 NOTE — Progress Notes (Signed)
Patient ID: Izabela Ow, female   DOB: 10-06-1980, 33 y.o.   MRN: [id number] FACULTY PRACTICE ANTEPARTUM(COMPREHENSIVE) NOTE  Mata Rowen is a 33 y.o. [id number] with Estimated Date of Delivery: 08/31/13   By  early ultrasound [redacted]w[redacted]d  who is admitted for viral illness and asthma exacerbation Class A2 diabetes, fetal macrosomia for primary Caesarean section at 39 weeks Fetal presentation is cephalic. Length of Stay:  3  Days  Date of admission:08/10/2013  Subjective: Patient reports feeling significantly better. She is able to breathe without difficulty. She reports not being able to ambulate on admission due to SOB but now is able to function without symptoms. Patient is no longer coughing. She desires to be discharged today. Patient reports the fetal movement as active. Patient reports uterine contraction  activity as none. Patient reports  vaginal bleeding as none. Patient describes fluid per vagina as None.  Vitals:  Blood pressure 104/76, pulse 87, temperature 97.9 F (36.6 C), temperature source Oral, resp. rate 24, height 5\' 2"  (1.575 m), weight 199 lb (90.266 kg), last menstrual period 11/18/2012, SpO2 96.00%. Filed Vitals:   08/12/13 2200 08/13/13 0011 08/13/13 0218 08/13/13 0558  BP:  104/76    Pulse: 92 87    Temp:  97.9 F (36.6 C)    TempSrc:  Oral    Resp:  24    Height:      Weight:      SpO2: 100%  97% 96%   Physical Examination:  General appearance - alert, well appearing, and in no distress Chest - Clear to auscultation bilaterally  Abdomen - soft, gravid, non tender Fundal Height:  size greater than dates Extremities: extremities normal, atraumatic, no cyanosis or edema with DTRs 2+ bilaterally Membranes:intact  Fetal Monitoring:  Baseline: 150 bpm, Variability: Good {> 6 bpm) and Accelerations: Reactive   reactive  Labs:  Results for orders placed during the hospital encounter of 08/10/13 (from the past 24 hour(s))  GLUCOSE, CAPILLARY   Collection Time   08/12/13  7:56 AM      Result Value Range   Glucose-Capillary 114 (*) 70 - 99 mg/dL  GLUCOSE, CAPILLARY   Collection Time    08/12/13 11:55 AM      Result Value Range   Glucose-Capillary 131 (*) 70 - 99 mg/dL  GLUCOSE, CAPILLARY   Collection Time    08/12/13  5:10 PM      Result Value Range   Glucose-Capillary 193 (*) 70 - 99 mg/dL  GLUCOSE, CAPILLARY   Collection Time    08/12/13 11:40 PM      Result Value Range   Glucose-Capillary 211 (*) 70 - 99 mg/dL      Medications:  Scheduled . albuterol  2.5 mg Nebulization Q4H  . azithromycin  500 mg Oral Daily  . cefTRIAXone (ROCEPHIN)  IV  1 g Intravenous Q12H  . docusate sodium  100 mg Oral Daily  . insulin aspart  0-14 Units Subcutaneous TID WC  . ipratropium  0.5 mg Nebulization Q4H  . methylPREDNISolone (SOLU-MEDROL) injection  125 mg Intravenous Q6H  . oseltamivir  75 mg Oral BID  . prenatal multivitamin  1 tablet Oral Q1200   I have reviewed the patient's current medications.  ASSESSMENT: Asthma exacerbation possibly from a viral illness Unlikely Community acquired pneumonia but will cover with late gestation  Patient Active Problem List   Diagnosis Date Noted  . Flu-like symptoms 08/10/2013  . LGA (large for gestational age) fetus affecting mother, antepartum 08/03/2013  . GBS (  group B streptococcus) UTI complicating pregnancy 05/20/2013  . Thrombocytopenia complicating pregnancy 04/29/2013  . Bilateral Pyelectasis of fetus on prenatal ultrasound 04/10/2013  . Diabetes mellitus, Class A2/B, antepartum 03/22/2013  . Obesity complicating pregnancy, childbirth, or the puerperium, antepartum condition or complication(649.13) 02/19/2013    PLAN: - Will discontinue solumedrol - Continue albuterol - Continue Azithromycin - Continue monitoring CBGs - Continue current antepartum care   Jacqueline Delapena

## 2013-08-13 NOTE — Progress Notes (Signed)
Patient refuses treatment.  Assessed patient and Breath sounds equal and clear with good aeration.  No wheezes present.  Patient in NAD.  Expresses to patient to let RN know if she needed a treatment.  Spoke with Dr. Penne LashLeggett to change treatments to Q4prn.

## 2013-08-13 NOTE — Progress Notes (Signed)
Patient ID: Laurie JerseyMuna Doering, female   DOB: 05/19/1981, 33 y.o.   MRN: [id number]  Called to evaluate FHT.  Since prior evaluation at 7 pm, there has been an increase in teh baseline to 170 and decrased variability.  Will obtain BPP stat.   CBG elevated adn NPH given.  Will also give 4 units of Novolog insulin nad recheck in 1 hour.

## 2013-08-14 ENCOUNTER — Inpatient Hospital Stay (HOSPITAL_COMMUNITY): Payer: Medicaid Other | Admitting: Anesthesiology

## 2013-08-14 ENCOUNTER — Encounter: Payer: Self-pay | Admitting: Obstetrics and Gynecology

## 2013-08-14 ENCOUNTER — Encounter (HOSPITAL_COMMUNITY)
Admission: EM | Disposition: A | Discharge status: Home / Self Care | Payer: Self-pay | Source: Home / Self Care | Attending: Obstetrics & Gynecology

## 2013-08-14 ENCOUNTER — Encounter (HOSPITAL_COMMUNITY): Payer: Medicaid Other | Admitting: Anesthesiology

## 2013-08-14 ENCOUNTER — Encounter (HOSPITAL_COMMUNITY): Payer: Self-pay | Admitting: *Deleted

## 2013-08-14 DIAGNOSIS — J45909 Unspecified asthma, uncomplicated: Secondary | ICD-10-CM

## 2013-08-14 DIAGNOSIS — O335XX0 Maternal care for disproportion due to unusually large fetus, not applicable or unspecified: Secondary | ICD-10-CM

## 2013-08-14 LAB — GLUCOSE, CAPILLARY
GLUCOSE-CAPILLARY: 176 mg/dL — AB (ref 70–99)
GLUCOSE-CAPILLARY: 196 mg/dL — AB (ref 70–99)
GLUCOSE-CAPILLARY: 96 mg/dL (ref 70–99)
Glucose-Capillary: 117 mg/dL — ABNORMAL HIGH (ref 70–99)
Glucose-Capillary: 127 mg/dL — ABNORMAL HIGH (ref 70–99)

## 2013-08-14 LAB — CBC
HCT: 29.6 % — ABNORMAL LOW (ref 36.0–46.0)
Hemoglobin: 9.7 g/dL — ABNORMAL LOW (ref 12.0–15.0)
MCH: 26.9 pg (ref 26.0–34.0)
MCHC: 32.8 g/dL (ref 30.0–36.0)
MCV: 82 fL (ref 78.0–100.0)
PLATELETS: 118 10*3/uL — AB (ref 150–400)
RBC: 3.61 MIL/uL — ABNORMAL LOW (ref 3.87–5.11)
RDW: 15.3 % (ref 11.5–15.5)
WBC: 9.2 10*3/uL (ref 4.0–10.5)

## 2013-08-14 SURGERY — Surgical Case
Anesthesia: Spinal | Site: Abdomen

## 2013-08-14 MED ORDER — ONDANSETRON HCL 4 MG/2ML IJ SOLN: 4.0000 mg | Freq: Three times a day (TID) | INTRAMUSCULAR | Status: DC | PRN

## 2013-08-14 MED ORDER — OXYCODONE-ACETAMINOPHEN 5-325 MG PO TABS
1.0000 | ORAL_TABLET | ORAL | Status: DC | PRN
  Administered 2013-08-14: 1 via ORAL
  Administered 2013-08-15 (×2): 2 via ORAL
  Administered 2013-08-15: 1 via ORAL
  Administered 2013-08-16 – 2013-08-17 (×5): 2 via ORAL
  Filled 2013-08-14: qty 2
  Filled 2013-08-14: qty 1
  Filled 2013-08-14 (×6): qty 2
  Filled 2013-08-14: qty 1

## 2013-08-14 MED ORDER — DIPHENHYDRAMINE HCL 50 MG/ML IJ SOLN: 12.5000 mg | INTRAMUSCULAR | Status: DC | PRN

## 2013-08-14 MED ORDER — FENTANYL CITRATE 0.05 MG/ML IJ SOLN: 25.0000 ug | INTRAMUSCULAR | Status: DC | PRN

## 2013-08-14 MED ORDER — MENTHOL 3 MG MT LOZG: 1.0000 | LOZENGE | OROMUCOSAL | Status: DC | PRN

## 2013-08-14 MED ORDER — DIBUCAINE 1 % RE OINT: 1.0000 "application " | TOPICAL_OINTMENT | RECTAL | Status: DC | PRN

## 2013-08-14 MED ORDER — NALBUPHINE HCL 10 MG/ML IJ SOLN
5.0000 mg | INTRAMUSCULAR | Status: DC | PRN
  Filled 2013-08-14: qty 1

## 2013-08-14 MED ORDER — DIPHENHYDRAMINE HCL 25 MG PO CAPS
25.0000 mg | ORAL_CAPSULE | Freq: Four times a day (QID) | ORAL | Status: DC | PRN
  Administered 2013-08-14: 25 mg via ORAL
  Filled 2013-08-14: qty 1

## 2013-08-14 MED ORDER — IBUPROFEN 600 MG PO TABS
600.0000 mg | ORAL_TABLET | Freq: Four times a day (QID) | ORAL | Status: DC
  Administered 2013-08-14 – 2013-08-17 (×12): 600 mg via ORAL
  Filled 2013-08-14 (×12): qty 1

## 2013-08-14 MED ORDER — SCOPOLAMINE 1 MG/3DAYS TD PT72
MEDICATED_PATCH | TRANSDERMAL | Status: AC
  Administered 2013-08-14: 1.5 mg via TRANSDERMAL
  Filled 2013-08-14: qty 1

## 2013-08-14 MED ORDER — OXYTOCIN 40 UNITS IN LACTATED RINGERS INFUSION - SIMPLE MED: 62.5000 mL/h | INTRAVENOUS | Status: AC

## 2013-08-14 MED ORDER — ALBUTEROL SULFATE (2.5 MG/3ML) 0.083% IN NEBU: 2.5000 mg | INHALATION_SOLUTION | RESPIRATORY_TRACT | Status: DC | PRN

## 2013-08-14 MED ORDER — ONDANSETRON HCL 4 MG PO TABS: 4.0000 mg | ORAL_TABLET | ORAL | Status: DC | PRN

## 2013-08-14 MED ORDER — NALOXONE HCL 0.4 MG/ML IJ SOLN: 0.4000 mg | INTRAMUSCULAR | Status: DC | PRN

## 2013-08-14 MED ORDER — SODIUM CHLORIDE 0.9 % IJ SOLN: 3.0000 mL | INTRAMUSCULAR | Status: DC | PRN

## 2013-08-14 MED ORDER — ONDANSETRON HCL 4 MG/2ML IJ SOLN
INTRAMUSCULAR | Status: AC
  Filled 2013-08-14: qty 2

## 2013-08-14 MED ORDER — METOCLOPRAMIDE HCL 5 MG/ML IJ SOLN
INTRAMUSCULAR | Status: DC | PRN
  Administered 2013-08-14: 10 mg via INTRAVENOUS

## 2013-08-14 MED ORDER — CITRIC ACID-SODIUM CITRATE 334-500 MG/5ML PO SOLN
ORAL | Status: AC
  Filled 2013-08-14: qty 15

## 2013-08-14 MED ORDER — WITCH HAZEL-GLYCERIN EX PADS: 1.0000 "application " | MEDICATED_PAD | CUTANEOUS | Status: DC | PRN

## 2013-08-14 MED ORDER — SCOPOLAMINE 1 MG/3DAYS TD PT72
1.0000 | MEDICATED_PATCH | Freq: Once | TRANSDERMAL | Status: DC
  Filled 2013-08-14: qty 1

## 2013-08-14 MED ORDER — FENTANYL CITRATE 0.05 MG/ML IJ SOLN
INTRAMUSCULAR | Status: AC
  Filled 2013-08-14: qty 2

## 2013-08-14 MED ORDER — METOCLOPRAMIDE HCL 5 MG/ML IJ SOLN
INTRAMUSCULAR | Status: AC
  Filled 2013-08-14: qty 2

## 2013-08-14 MED ORDER — PHENYLEPHRINE 8 MG IN D5W 100 ML (0.08MG/ML) PREMIX OPTIME
INJECTION | INTRAVENOUS | Status: DC | PRN
  Administered 2013-08-14: 60 ug/min via INTRAVENOUS

## 2013-08-14 MED ORDER — MEPERIDINE HCL 25 MG/ML IJ SOLN: 6.2500 mg | INTRAMUSCULAR | Status: DC | PRN

## 2013-08-14 MED ORDER — DIPHENHYDRAMINE HCL 25 MG PO CAPS
25.0000 mg | ORAL_CAPSULE | ORAL | Status: DC | PRN
  Filled 2013-08-14: qty 1

## 2013-08-14 MED ORDER — LACTATED RINGERS IV SOLN
INTRAVENOUS | Status: DC
  Administered 2013-08-14: 13:00:00 via INTRAVENOUS

## 2013-08-14 MED ORDER — NALOXONE HCL 1 MG/ML IJ SOLN
1.0000 ug/kg/h | INTRAVENOUS | Status: DC | PRN
  Filled 2013-08-14: qty 2

## 2013-08-14 MED ORDER — MISOPROSTOL 200 MCG PO TABS
ORAL_TABLET | ORAL | Status: AC
  Filled 2013-08-14: qty 5

## 2013-08-14 MED ORDER — TETANUS-DIPHTH-ACELL PERTUSSIS 5-2.5-18.5 LF-MCG/0.5 IM SUSP: 0.5000 mL | Freq: Once | INTRAMUSCULAR | Status: DC

## 2013-08-14 MED ORDER — DIPHENHYDRAMINE HCL 50 MG/ML IJ SOLN
25.0000 mg | INTRAMUSCULAR | Status: DC | PRN
Start: 2013-08-14 — End: 2013-08-14

## 2013-08-14 MED ORDER — PRENATAL MULTIVITAMIN CH
1.0000 | ORAL_TABLET | Freq: Every day | ORAL | Status: DC
  Administered 2013-08-14 – 2013-08-16 (×3): 1 via ORAL
  Filled 2013-08-14 (×3): qty 1

## 2013-08-14 MED ORDER — DIPHENHYDRAMINE HCL 25 MG PO CAPS: 25.0000 mg | ORAL_CAPSULE | ORAL | Status: DC | PRN

## 2013-08-14 MED ORDER — LACTATED RINGERS IV SOLN
INTRAVENOUS | Status: DC | PRN
  Administered 2013-08-14: 01:00:00 via INTRAVENOUS

## 2013-08-14 MED ORDER — KETOROLAC TROMETHAMINE 30 MG/ML IJ SOLN: 30.0000 mg | Freq: Four times a day (QID) | INTRAMUSCULAR | Status: DC | PRN

## 2013-08-14 MED ORDER — SIMETHICONE 80 MG PO CHEW
80.0000 mg | CHEWABLE_TABLET | ORAL | Status: DC | PRN
  Administered 2013-08-14: 80 mg via ORAL
  Filled 2013-08-14: qty 1

## 2013-08-14 MED ORDER — ONDANSETRON HCL 4 MG/2ML IJ SOLN: 4.0000 mg | INTRAMUSCULAR | Status: DC | PRN

## 2013-08-14 MED ORDER — LANOLIN HYDROUS EX OINT: 1.0000 "application " | TOPICAL_OINTMENT | CUTANEOUS | Status: DC | PRN

## 2013-08-14 MED ORDER — DIPHENHYDRAMINE HCL 50 MG/ML IJ SOLN
INTRAMUSCULAR | Status: DC | PRN
  Administered 2013-08-14: 25 mg via INTRAVENOUS

## 2013-08-14 MED ORDER — OXYTOCIN 10 UNIT/ML IJ SOLN
INTRAMUSCULAR | Status: AC
  Filled 2013-08-14: qty 4

## 2013-08-14 MED ORDER — NALOXONE HCL 1 MG/ML IJ SOLN
1.0000 ug/kg/h | INTRAMUSCULAR | Status: DC | PRN
  Filled 2013-08-14: qty 2

## 2013-08-14 MED ORDER — SIMETHICONE 80 MG PO CHEW
80.0000 mg | CHEWABLE_TABLET | ORAL | Status: DC
  Administered 2013-08-14 – 2013-08-16 (×3): 80 mg via ORAL
  Filled 2013-08-14 (×3): qty 1

## 2013-08-14 MED ORDER — DIPHENHYDRAMINE HCL 50 MG/ML IJ SOLN: 25.0000 mg | INTRAMUSCULAR | Status: DC | PRN

## 2013-08-14 MED ORDER — AZITHROMYCIN 500 MG PO TABS
500.0000 mg | ORAL_TABLET | Freq: Every day | ORAL | Status: DC
  Administered 2013-08-14 – 2013-08-16 (×3): 500 mg via ORAL
  Filled 2013-08-14 (×4): qty 1

## 2013-08-14 MED ORDER — DIPHENHYDRAMINE HCL 50 MG/ML IJ SOLN
INTRAMUSCULAR | Status: AC
  Filled 2013-08-14: qty 1

## 2013-08-14 MED ORDER — METOCLOPRAMIDE HCL 5 MG/ML IJ SOLN: 10.0000 mg | Freq: Three times a day (TID) | INTRAMUSCULAR | Status: DC | PRN

## 2013-08-14 MED ORDER — SENNOSIDES-DOCUSATE SODIUM 8.6-50 MG PO TABS
2.0000 | ORAL_TABLET | ORAL | Status: DC
  Administered 2013-08-14 – 2013-08-16 (×3): 2 via ORAL
  Filled 2013-08-14 (×3): qty 2

## 2013-08-14 MED ORDER — SCOPOLAMINE 1 MG/3DAYS TD PT72
1.0000 | MEDICATED_PATCH | Freq: Once | TRANSDERMAL | Status: AC
  Administered 2013-08-14: 1.5 mg via TRANSDERMAL

## 2013-08-14 MED ORDER — KETOROLAC TROMETHAMINE 30 MG/ML IJ SOLN
30.0000 mg | Freq: Four times a day (QID) | INTRAMUSCULAR | Status: DC | PRN
Start: 2013-08-14 — End: 2013-08-14

## 2013-08-14 MED ORDER — ONDANSETRON HCL 4 MG/2ML IJ SOLN
INTRAMUSCULAR | Status: DC | PRN
  Administered 2013-08-14: 4 mg via INTRAVENOUS

## 2013-08-14 MED ORDER — MORPHINE SULFATE 0.5 MG/ML IJ SOLN
INTRAMUSCULAR | Status: AC
  Filled 2013-08-14: qty 10

## 2013-08-14 MED ORDER — MEASLES, MUMPS & RUBELLA VAC ~~LOC~~ INJ
0.5000 mL | INJECTION | Freq: Once | SUBCUTANEOUS | Status: DC
  Filled 2013-08-14: qty 0.5

## 2013-08-14 MED ORDER — PHENYLEPHRINE HCL 10 MG/ML IJ SOLN
INTRAMUSCULAR | Status: AC
  Filled 2013-08-14: qty 1

## 2013-08-14 SURGICAL SUPPLY — 30 items
BENZOIN TINCTURE PRP APPL 2/3 (GAUZE/BANDAGES/DRESSINGS) ×2 IMPLANT
CLAMP CORD UMBIL (MISCELLANEOUS) IMPLANT
CLOTH BEACON ORANGE TIMEOUT ST (SAFETY) ×2 IMPLANT
CONTAINER PREFILL 10% NBF 15ML (MISCELLANEOUS) IMPLANT
DRAIN JACKSON PRT FLT 7MM (DRAIN) IMPLANT
DRAPE LG THREE QUARTER DISP (DRAPES) IMPLANT
DRSG OPSITE POSTOP 4X10 (GAUZE/BANDAGES/DRESSINGS) ×2 IMPLANT
DURAPREP 26ML APPLICATOR (WOUND CARE) ×2 IMPLANT
ELECT REM PT RETURN 9FT ADLT (ELECTROSURGICAL) ×2
ELECTRODE REM PT RTRN 9FT ADLT (ELECTROSURGICAL) ×1 IMPLANT
EVACUATOR SILICONE 100CC (DRAIN) IMPLANT
EXTRACTOR VACUUM M CUP 4 TUBE (SUCTIONS) IMPLANT
GLOVE BIO SURGEON STRL SZ7 (GLOVE) ×2 IMPLANT
GLOVE BIOGEL PI IND STRL 7.0 (GLOVE) ×1 IMPLANT
GLOVE BIOGEL PI INDICATOR 7.0 (GLOVE) ×1
GOWN STRL REUS W/TWL LRG LVL3 (GOWN DISPOSABLE) ×4 IMPLANT
KIT ABG SYR 3ML LUER SLIP (SYRINGE) IMPLANT
NEEDLE HYPO 25X5/8 SAFETYGLIDE (NEEDLE) ×2 IMPLANT
NS IRRIG 1000ML POUR BTL (IV SOLUTION) ×2 IMPLANT
PACK C SECTION WH (CUSTOM PROCEDURE TRAY) ×2 IMPLANT
PAD OB MATERNITY 4.3X12.25 (PERSONAL CARE ITEMS) ×2 IMPLANT
RTRCTR C-SECT PINK 25CM LRG (MISCELLANEOUS) ×2 IMPLANT
STAPLER VISISTAT 35W (STAPLE) IMPLANT
STRIP CLOSURE SKIN 1/2X4 (GAUZE/BANDAGES/DRESSINGS) ×2 IMPLANT
SUT VIC AB 0 CTX 36 (SUTURE) ×5
SUT VIC AB 0 CTX36XBRD ANBCTRL (SUTURE) ×5 IMPLANT
SUT VIC AB 4-0 KS 27 (SUTURE) IMPLANT
TOWEL OR 17X24 6PK STRL BLUE (TOWEL DISPOSABLE) ×2 IMPLANT
TRAY FOLEY CATH 14FR (SET/KITS/TRAYS/PACK) ×2 IMPLANT
WATER STERILE IRR 1000ML POUR (IV SOLUTION) IMPLANT

## 2013-08-14 NOTE — Progress Notes (Signed)
TC to Dr Penne LashLeggett as requested by Respiratory Therapist to inform that pt refusing treatments stating that she is feeling better and strip with fetal tachycardia and non reassuring and unsure if strip is related to nebulizer med and persistent elevated blood glucose.  Phone given to Respiratory Therapist to speak with MD regarding tx recommendation. Telephone order received.

## 2013-08-14 NOTE — Progress Notes (Signed)
To OR 2

## 2013-08-14 NOTE — Progress Notes (Signed)
Subjective: Postpartum Day 0: Cesarean Delivery Patient reports  No problems.  She denies current SOB or difficulty breathing.  She reports that she has a h/o asthma but, only uses her inhaler once every 6 months or so.  She reports that she began developing URI sx 4 days previously.  But, feels much better now.    Objective: Vital signs in last 24 hours: Temp:  [97.7 F (36.5 C)-98.5 F (36.9 C)] 98.2 F (36.8 C) (02/11 0850) Pulse Rate:  [71-112] 78 (02/11 0850) Resp:  [14-20] 18 (02/11 0850) BP: (87-128)/(43-73) 87/43 mmHg (02/11 0850) SpO2:  [94 %-100 %] 100 % (02/11 0850)  Physical Exam:  General: alert and no distress Lungs: CTA CV; RRR  CBC    Component Value Date/Time   WBC 8.6 08/10/2013 1645   RBC 4.10 08/10/2013 1645   HGB 11.6* 08/10/2013 1704   HCT 34.0* 08/10/2013 1704   PLT 128* 08/10/2013 1645   MCV 81.0 08/10/2013 1645   MCH 27.6 08/10/2013 1645   MCHC 34.0 08/10/2013 1645   RDW 14.8 08/10/2013 1645   LYMPHSABS 1.0 08/10/2013 1645   MONOABS 0.5 08/10/2013 1645   EOSABS 0.0 08/10/2013 1645   BASOSABS 0.0 08/10/2013 1645     Assessment/Plan: Status post Cesarean section. Doing well postoperatively.  Had URI sx preop and presumably an asthma exacerbation which is clinically improved- will order Albuterol nebs for prn use as pt concerned about possible exacerbation of sx with no meds. Cont Zithromax as ordered  HARRAWAY-SMITH, Annjeanette Sarwar 08/14/2013, 9:49 AM

## 2013-08-14 NOTE — Anesthesia Procedure Notes (Signed)
Spinal  Patient location during procedure: OR Start time: 08/14/2013 12:43 AM Preanesthetic Checklist Completed: patient identified, site marked, surgical consent, pre-op evaluation, timeout performed, IV checked, risks and benefits discussed and monitors and equipment checked Spinal Block Patient position: sitting Prep: DuraPrep Patient monitoring: heart rate, cardiac monitor, continuous pulse ox and blood pressure Approach: midline Location: L3-4 Injection technique: single-shot Needle Needle type: Sprotte  Needle gauge: 24 G Needle length: 9 cm Assessment Sensory level: T4 Additional Notes Spinal Dosage in OR  Bupivicaine ml       1.3 PFMS04   mcg        150

## 2013-08-14 NOTE — Anesthesia Preprocedure Evaluation (Signed)
Anesthesia Evaluation  Patient identified by MRN, date of birth, ID band Patient awake    Reviewed: Allergy & Precautions, H&P , Patient's Chart, lab work & pertinent test results  Airway Mallampati: II TM Distance: >3 FB Neck ROM: full    Dental no notable dental hx.    Pulmonary asthma ,  breath sounds clear to auscultation  Pulmonary exam normal       Cardiovascular Exercise Tolerance: Good Rhythm:regular Rate:Normal     Neuro/Psych    GI/Hepatic   Endo/Other  diabetes  Renal/GU      Musculoskeletal   Abdominal   Peds  Hematology   Anesthesia Other Findings   Reproductive/Obstetrics                           Anesthesia Physical Anesthesia Plan  ASA: II and emergent  Anesthesia Plan: Spinal   Post-op Pain Management:    Induction:   Airway Management Planned:   Additional Equipment:   Intra-op Plan:   Post-operative Plan:   Informed Consent: I have reviewed the patients History and Physical, chart, labs and discussed the procedure including the risks, benefits and alternatives for the proposed anesthesia with the patient or authorized representative who has indicated his/her understanding and acceptance.   Dental Advisory Given  Plan Discussed with: CRNA  Anesthesia Plan Comments: (Lab work confirmed with CRNA in room. Platelets okay. Discussed spinal anesthetic, and patient consents to the procedure:  included risk of possible headache,backache, failed block, allergic reaction, and nerve injury. This patient was asked if she had any questions or concerns before the procedure started. )        Anesthesia Quick Evaluation

## 2013-08-14 NOTE — Transfer of Care (Signed)
Immediate Anesthesia Transfer of Care Note  Patient: Laurie Hess  Procedure(s) Performed: Procedure(s): CESAREAN SECTION (N/A)  Patient Location: PACU  Anesthesia Type:Spinal  Level of Consciousness: awake, alert  and oriented  Airway & Oxygen Therapy: Patient Spontanous Breathing  Post-op Assessment: Report given to PACU RN and Post -op Vital signs reviewed and stable  Post vital signs: Reviewed and stable  Complications: No apparent anesthesia complications

## 2013-08-14 NOTE — Anesthesia Postprocedure Evaluation (Signed)
  Anesthesia Post-op Note  Patient: Laurie Hess  Procedure(s) Performed: Procedure(s): CESAREAN SECTION (N/A)  Patient is awake, responsive, moving her legs, and has signs of resolution of her numbness. Pain and nausea are reasonably well controlled. Vital signs are stable and clinically acceptable. Oxygen saturation is clinically acceptable. There are no apparent anesthetic complications at this time. Patient is ready for discharge.

## 2013-08-14 NOTE — Progress Notes (Signed)
Pt took herself off of the monitor.  Said that she was tired of laying in the bed and did not want to go back on because she had been on the fetal monitor for too long.  After much coaxing pt refusing.  Pulled strip and allowed to view past strips to current to see change in fetus.  Pt now willing and cooperative placed on Left lateral side.

## 2013-08-14 NOTE — Progress Notes (Signed)
Off monitor for Bedside US .

## 2013-08-14 NOTE — Op Note (Signed)
Sendy Byrns PROCEDURE DATE: 08/10/2013 - 08/14/2013  PREOPERATIVE DIAGNOSIS: Intrauterine pregnancy at  3535w4d weeks gestation with non reassuring fetal status (BPP 2/10)  POSTOPERATIVE DIAGNOSIS: The same  PROCEDURE:    Low Transverse Cesarean Section  SURGEON:  Dr. Elsie LincolnKelly Tanaja Ganger   INDICATIONS: Joellen JerseyMuna Gordon is a 33 y.o. [id number] at 4135w4d.  The risks of cesarean section discussed with the patient included but were not limited to: bleeding which may require transfusion or reoperation; infection which may require antibiotics; injury to bowel, bladder, ureters or other surrounding organs; injury to the fetus; need for additional procedures including hysterectomy in the event of a life-threatening hemorrhage; placental abnormalities wth subsequent pregnancies, incisional problems, thromboembolic phenomenon and other postoperative/anesthesia complications. The patient concurred with the proposed plan, giving informed written consent for the procedure.    FINDINGS:  Viable female  infant in cephalic presentation, Apgars, weight to be determined in 1 hour, clear amniotic fluid.  Intact placenta, three vessel cord.  Grossly normal uterus, ovaries and fallopian tubes. .   ANESTHESIA:   Spinal  ESTIMATED BLOOD LOSS: 800  SPECIMENS: Placenta sent to pathology  COMPLICATIONS: None immediate  PROCEDURE IN DETAIL:  The patient received intravenous antibiotics and had sequential compression devices applied to her lower extremities.  Spinal anesthesia was administered. She was then placed in a dorsal supine position with a leftward tilt, and prepped and draped in a sterile manner.  A foley catheter was placed into her bladder and attached to constant gravity.  After an adequate timeout was performed, a Pfannenstiel skin incision was made with scalpel and carried through to the underlying layer of fascia. The fascia was incised in the midline and this incision was extended bilaterally using the Mayo scissors.  Kocher clamps were applied to the superior aspect of the fascial incision and the underlying rectus muscles were dissected off bluntly. A similar process was carried out on the inferior aspect of the facial incision. The rectus muscles were separated in the midline bluntly and the peritoneum was entered bluntly.   A transverse hysterotomy was made with a scalpel and extended bilaterally bluntly. The bladder blade was then removed. The infant was successfully delivered, and cord was clamped and cut and infant was handed over to awaiting neonatology team. Uterine massage was then administered and the placenta delivered intact with three-vessel cord. The uterus was cleared of clot and debris.  The hysterotomy was closed with 0 vicryl.  A second imbricating suture of 0-Vicryl was used to reinforce portions of the incision.  An entire imbricating layer could not be plasced due to bleeding at serosa edge.  The peritoneum and rectus muscles were noted to be hemostatic.  The fascia was closed with 0-Vicryl in a running fashion with good restoration of anatomy.  The subcutaneus tissue was copiously irrigated.  The skin was closed with 4-0 Vicryl in a subcuticular fashion.    Pt tolerated the procedure will.  All counts were correct x2.  Pt went to the recovery room in stable condition.

## 2013-08-15 ENCOUNTER — Encounter (HOSPITAL_COMMUNITY): Payer: Self-pay | Admitting: Obstetrics & Gynecology

## 2013-08-15 LAB — GLUCOSE, CAPILLARY
GLUCOSE-CAPILLARY: 215 mg/dL — AB (ref 70–99)
Glucose-Capillary: 91 mg/dL (ref 70–99)
Glucose-Capillary: 106 mg/dL — ABNORMAL HIGH (ref 70–99)
Glucose-Capillary: 137 mg/dL — ABNORMAL HIGH (ref 70–99)

## 2013-08-15 MED ORDER — PNEUMOCOCCAL VAC POLYVALENT 25 MCG/0.5ML IJ INJ
0.5000 mL | INJECTION | INTRAMUSCULAR | Status: AC
  Administered 2013-08-17: 0.5 mL via INTRAMUSCULAR
  Filled 2013-08-15: qty 0.5

## 2013-08-15 NOTE — Progress Notes (Signed)
08/15/13 1400  Clinical Encounter Type  Visited With Patient and family together (husband)  Visit Type Initial   Made initial visit with Ms Laurie Hess and her husband to introduce Spiritual Care and chaplain availability.  [place] older [place] (5 and 7[place], are Hindu, and moved to the [place] 5 [place] ago (after their second child was born).  At this time, Ms Laurie Hess's top stated concern is wanting to breastfeed baby and to avoid formula.  Family is aware of chaplain support, but please also page as needed:  [phone].  Thank you.    [address]Chaplain Aaleeyah Bias GraceLundeen, [place] [phone]

## 2013-08-15 NOTE — Lactation Note (Signed)
This note was copied from the chart of Laurie Mayzie Raso. 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  Mom began pumping with DEP today, and I reviewed teaching with her on premie setting and hand expression. I was not able to express any colostrum, but mom has been ill the past 3 days with a virus,  And this could be affecting her production. Mom is active with WIC, and knows to call for DEP. Skin to skin and breast feeding in the nICU encouraged. Mom knows to have her nurse call for lactation questions/concerns.  Patient Name: Laurie Hess     Maternal Data    Feeding    LATCH Score/Interventions                      Lactation Tools Discussed/Used     Consult Status      Laurie Hess, Laurie Hess Hess, 9:51 AM

## 2013-08-15 NOTE — Lactation Note (Signed)
This note was copied from the chart of Laurie Teya Carl. Lactation Consultation Note    Follow up consult with this mom and NICU baby. They are now 35 hours post partum, and baby is feeding for the first time. On exam, mom now has easily expressed colostrum. She brest fed the baby for the first time, and he suckled well for about 15 minutes. He is now feeding on demand.  Mom instructed to keep pumping every 3 hours. I will follow up with this family tomorrow, and in the nICU.  Patient Name: Laurie Hess Reason for consult: Follow-up assessment;NICU baby   Maternal Data    Feeding Feeding Type: Breast Fed Length of feed: 15 min  LATCH Score/Interventions Latch: Repeated attempts needed to sustain latch, nipple held in mouth throughout feeding, stimulation needed to elicit sucking reflex. Intervention(s): Adjust position;Assist with latch (mom did not want help - persisted with cradle hold, and eventually got baby latched with strong, rhythmic suckles)  Audible Swallowing: A few with stimulation (easily expresed colostrum) Intervention(s): Skin to skin;Hand expression  Type of Nipple: Everted at rest and after stimulation  Comfort (Breast/Nipple): Soft / non-tender     Hold (Positioning): No assistance needed to correctly position infant at breast.  LATCH Score: 8  Lactation Tools Discussed/Used Breast pump type: Double-Electric Breast Pump WIC Program: Yes (explained DEP loaner program to dad, incase mom get discharged this weekend. If d/c friday, i explained that mom will need a pump tomorrow, not  [facility] Pump Review: Setup, frequency, and cleaning (mom  advised to keep pumping every 3 hours, despite starteing to latch/brest feed baby)   Consult Status Consult Status: Follow-up Date: 08/16/13 Follow-up type: In-patient    Laurie Hess, Laurie Hess Hess, 1:44 PM

## 2013-08-15 NOTE — Progress Notes (Signed)
UR completed 

## 2013-08-15 NOTE — Progress Notes (Signed)
Subjective: Postpartum Day 1: Cesarean Delivery Patient reports tolerating PO, + flatus and no problems voiding.    Objective: Vital signs in last 24 hours: Temp:  [97.6 F (36.4 C)-98.4 F (36.9 C)] 97.6 F (36.4 C) (02/12 0625) Pulse Rate:  [64-93] 64 (02/12 0625) Resp:  [16-18] 18 (02/12 0625) BP: (92-101)/(40-57) 101/57 mmHg (02/12 0625) SpO2:  [93 %-100 %] 93 % (02/12 21300625)  Physical Exam:  General: alert, cooperative, appears stated age, no distress and mildly obese Lochia: appropriate Abd:  Soft, mildly obese, Appropriately tender, nondistended Incision: healing well, no significant drainage, no dehiscence, no significant erythema DVT Evaluation: No evidence of DVT seen on physical exam. No cords or calf tenderness. No significant calf/ankle edema.  Pulm: decreased air movement right apex.  + wheezing R>L   Recent Labs  08/14/13 1252  HGB 9.7*  HCT 29.6*    Assessment/Plan:  #Status post Cesarean section. Doing well postoperatively.  # CAP. Afebrile. Continue Azithro # Asthma. Continue albuterol scheduled. No current exacerbation.  Encourage incentive spirometry   Quincy SimmondsFeeney, Patricia L 08/15/2013, 9:27 AM  I have seen and examined this patient and agree with above documentation in the residents's note. I agree with the respiratory exam above. Incision c/d/i with honeycomb in place.   Rulon AbideKeli Abdou Stocks, M.D. [facility] 08/15/2013 11:24 AM

## 2013-08-16 ENCOUNTER — Other Ambulatory Visit: Payer: Medicaid Other

## 2013-08-16 LAB — GLUCOSE, CAPILLARY
GLUCOSE-CAPILLARY: 117 mg/dL — AB (ref 70–99)
GLUCOSE-CAPILLARY: 144 mg/dL — AB (ref 70–99)
Glucose-Capillary: 132 mg/dL — ABNORMAL HIGH (ref 70–99)
Glucose-Capillary: 127 mg/dL — ABNORMAL HIGH (ref 70–99)

## 2013-08-16 NOTE — Progress Notes (Signed)
Patient ID: Laurie Hess, female   DOB: 01/25/1981, 33 y.o.   MRN: [id number] Subjective: Postpartum Day 2: Cesarean Delivery Patient reports tolerating PO, + flatus and no problems voiding.    Objective: Vital signs in last 24 hours: Temp:  [97.6 F (36.4 C)-98 F (36.7 C)] 97.6 F (36.4 C) (02/13 1145) Pulse Rate:  [65-92] 77 (02/13 1145) Resp:  [18-20] 18 (02/13 1145) BP: (99-126)/(62-67) 121/67 mmHg (02/13 1145) SpO2:  [96 %-100 %] 100 % (02/13 1145)  Physical Exam:  General: alert, cooperative, appears stated age, no distress and mildly obese Lochia: appropriate Abd:  Soft, mildly obese, Appropriately tender, nondistended Incision: healing well, no significant drainage, no dehiscence, no significant erythema DVT Evaluation: No evidence of DVT seen on physical exam. No cords or calf tenderness. No significant calf/ankle edema.  Pulm: CTAB   Recent Labs  08/14/13 1252  HGB 9.7*  HCT 29.6*    Assessment/Plan:  #Status post Cesarean section. Doing well postoperatively. d/c home tomorrow # CAP. Afebrile. Continue Azithro # Asthma. Continue albuterol scheduled. No current exacerbation.  Encourage incentive spirometry   Dawsen Krieger L 08/16/2013, 12:04 PM

## 2013-08-16 NOTE — Lactation Note (Signed)
This note was copied from the chart of Laurie Chantale Sundeen. Lactation Consultation Note     Follow up consult with this mom of a term baby, now 65 hours pot partum. Mom is transitioning into mature milk, expressing 20-30 mls at a time now. I loaned her a [employer] DEP, and advised mom to pump every 3 hours, for 15-30 minutes I instructed mom int the pump use. i will follow up with this family in the nICU.  Patient Name: Laurie Hess HYQMV'HToday's Date: 08/16/2013 Reason for consult: Follow-up assessment;NICU baby   Maternal Data    Feeding Feeding Type: Breast Milk Nipple Type: Slow - flow Length of feed: 10 min  LATCH Score/Interventions Latch: Too sleepy or reluctant, no latch achieved, no sucking elicited.  Audible Swallowing: None  Type of Nipple: Everted at rest and after stimulation  Comfort (Breast/Nipple): Soft / non-tender     Hold (Positioning): No assistance needed to correctly position infant at breast.  LATCH Score: 6  Lactation Tools [facility]iscussed/[facility]   Consult Status      Laurie Hess, Laurie Hess 08/16/2013, 7:03 PM

## 2013-08-17 LAB — GLUCOSE, CAPILLARY: GLUCOSE-CAPILLARY: 94 mg/dL (ref 70–99)

## 2013-08-17 LAB — RPR: RPR: NONREACTIVE

## 2013-08-17 MED ORDER — OXYCODONE-ACETAMINOPHEN 5-325 MG PO TABS: 1.0000 | ORAL_TABLET | ORAL | Status: DC | PRN

## 2013-08-17 MED ORDER — IBUPROFEN 600 MG PO TABS: 600.0000 mg | ORAL_TABLET | Freq: Four times a day (QID) | ORAL | Status: AC | PRN

## 2013-08-17 NOTE — Discharge Summary (Signed)
Obstetric Discharge Summary Reason for Admission: flu-like illness w/ asthma exacerbation and hypotension Prenatal Procedures: ultrasound, NSTs Intrapartum Procedures: cesarean: low cervical, transverse, primary for  non-reassuring fetal status BPP 2/10 Postpartum Procedures: antibiotics Complications-Operative and Postpartum: none Eating, drinking, voiding, ambulating well.  +flatus.  Lochia and pain wnl.  Denies dizziness, lightheadedness, sob or wheezing.  Non-productive cough.   Hemoglobin  Date Value Ref Range Status  08/14/2013 9.7* 12.0 - 15.0 g/dL Final     HCT  Date Value Ref Range Status  08/14/2013 29.6* 36.0 - 46.0 % Final   Hospital Course:  Joellen JerseyMuna Cotterman is a 33 y.o. now [id number] female w/ A2/B DM w/ initially planned PLTCS @ 39wks d/t fetal macrosomia, who was admitted moderately ill at 37wks w/ febrile flu-like illness and asthma exacerbation w/ hypotension, normal wbc & lactic acid, reactive nst. She was treated w/ tamiflu, albuterol inhaler/nebs, & solumedrol in addition to her metformin and glyburide. Azithromycin was started on hospital day #2 to cover for possibility of CAP. Flu screens returned neg. On hospital day #4, fhr became tachy w/ decreased variability and bpp was 2/10 so a PLTCS was performed, apgars 1/8 and infant taken to NICU.  Shakora did well post-operatively, fasting and 2hr pp cbg's mildly elevated. She is being d/c'd on POD#3 w/o diabetic meds. Will do 2hr gtt 6-8wks pp. She has completed 5d course of azithromycin.    Physical Exam:  General: alert, cooperative and no distress HRRR LCTAB, no wheezing or adventitious breath sounds, no respiratory distress Lochia: appropriate Uterine Fundus: firm Incision: healing well, no significant drainage, no dehiscence, no significant erythema DVT Evaluation: No evidence of DVT seen on physical exam. Negative Homan's sign. No cords or calf tenderness. Calf/Ankle edema is present, 2+ BLE edema.  Discharge Diagnoses:  Term Pregnancy-delivered, PLTCS for non-reassuring fetal status after maternal illness w/ flu-like illness/CAP/asthma exacerbation  Discharge Information: Date: 08/17/2013 Activity: pelvic rest Diet: routine Medications: PNV, Ibuprofen and Percocet Condition: stable Instructions: refer to practice specific booklet Discharge to: home Follow-up Information   [facility] (As needed if symptoms worsen)    Contact information:   [address] [place] [address] [phone]      [facility]   Specialty:  Obstetrics and Gynecology   Contact information:   [address] [place] [address] [phone]      Schedule an appointment as soon as possible for a visit to follow up. (4-6 weeks for your postpartum visit)       Newborn Data: Live born female  Birth Weight: 9 lb 9.1 oz (4340 g) APGAR: 1, 8  Infant in NICU  Breastfeeding. Encouraged to pump while at home and bring milk to infant/nurse when she's here Desires mirena for contraception Declines circumcision  Marge DuncansBooker, Kimberly Randall 08/17/2013, 10:12 AM

## 2013-08-17 NOTE — Progress Notes (Signed)
Discharge instructions reviewed with patient and significant other.  Patient states understanding of home care, medications, activity, signs/symptoms to report to MD and return MD office visit.  No home equipment needed.  Patient ambulated for discharge in stable condition with staff without incident.

## 2013-08-17 NOTE — Discharge Instructions (Signed)
Cesarean Delivery °Care After °Refer to this sheet in the next few weeks. These instructions provide you with information on caring for yourself after your procedure. Your health care provider may also give you specific instructions. Your treatment has been planned according to current medical practices, but problems sometimes occur. Call your health care provider if you have any problems or questions after you go home. °HOME CARE INSTRUCTIONS  °· Only take over-the-counter or prescription medications as directed by your health care provider. °· Do not drink alcohol, especially if you are breastfeeding or taking medication to relieve pain. °· Do not chew or smoke tobacco. °· Continue to use good perineal care. Good perineal care includes: °· Wiping your perineum from front to back. °· Keeping your perineum clean. °· Check your surgical cut (incision) daily for increased redness, drainage, swelling, or separation of skin. °· Clean your incision gently with soap and water every day, and then pat it dry. If your health care provider says it is OK, leave the incision uncovered. Use a bandage (dressing) if the incision is draining fluid or appears irritated. If the adhesive strips across the incision do not fall off within 7 days, carefully peel them off. °· Hug a pillow when coughing or sneezing until your incision is healed. This helps to relieve pain. °· Do not use tampons or douche until your health care provider says it is okay. °· Shower, wash your hair, and take tub baths as directed by your health care provider. °· Wear a well-fitting bra that provides breast support. °· Limit wearing support panties or control-top hose. °· Drink enough fluids to keep your urine clear or pale yellow. °· Eat high-fiber foods such as whole grain cereals and breads, brown rice, beans, and fresh fruits and vegetables every day. These foods may help prevent or relieve constipation. °· Resume activities such as climbing stairs,  driving, lifting, exercising, or traveling as directed by your health care provider. °· Talk to your health care provider about resuming sexual activities. This is dependent upon your risk of infection, your rate of healing, and your comfort and desire to resume sexual activity. °· Try to have someone help you with your household activities and your newborn for at least a few days after you leave the hospital. °· Rest as much as possible. Try to rest or take a nap when your newborn is sleeping. °· Increase your activities gradually. °· Keep all of your scheduled postpartum appointments. It is very important to keep your scheduled follow-up appointments. At these appointments, your health care provider will be checking to make sure that you are healing physically and emotionally. °SEEK MEDICAL CARE IF:  °· You are passing large clots from your vagina. Save any clots to show your health care provider. °· You have a foul smelling discharge from your vagina. °· You have trouble urinating. °· You are urinating frequently. °· You have pain when you urinate. °· You have a change in your bowel movements. °· You have increasing redness, pain, or swelling near your incision. °· You have pus draining from your incision. °· Your incision is separating. °· You have painful, hard, or reddened breasts. °· You have a severe headache. °· You have blurred vision or see spots. °· You feel sad or depressed. °· You have thoughts of hurting yourself or your newborn. °· You have questions about your care, the care of your newborn, or medications. °· You are dizzy or lightheaded. °· You have a rash. °· You   have pain, redness, or swelling at the site of the removed intravenous access (IV) tube.  You have nausea or vomiting.  You stopped breastfeeding and have not had a menstrual period within 12 weeks of stopping.  You are not breastfeeding and have not had a menstrual period within 12 weeks of delivery.  You have a fever. SEEK  IMMEDIATE MEDICAL CARE IF:  You have persistent pain.  You have chest pain.  You have shortness of breath.  You faint.  You have leg pain.  You have stomach pain.  Your vaginal bleeding saturates 2 or more sanitary pads in 1 hour. MAKE SURE YOU:   Understand these instructions.  Will watch your condition.  Will get help right away if you are not doing well or get worse. Document Released: 03/12/2002 Document Revised: 02/20/2013 Document Reviewed: 02/15/2012 [facility] Patient Information 2014 [place].  Breastfeeding Deciding to breastfeed is one of the best choices you can make for you and your baby. A change in hormones during pregnancy causes your breast tissue to grow and increases the number and size of your milk ducts. These hormones also allow proteins, sugars, and fats from your blood supply to make breast milk in your milk-producing glands. Hormones prevent breast milk from being released before your baby is born as well as prompt milk flow after birth. Once breastfeeding has begun, thoughts of your baby, as well as his or her sucking or crying, can stimulate the release of milk from your milk-producing glands.  BENEFITS OF BREASTFEEDING For Your Baby  Your first milk (colostrum) helps your baby's digestive system function better.   There are antibodies in your milk that help your baby fight off infections.   Your baby has a lower incidence of asthma, allergies, and sudden infant death syndrome.   The nutrients in breast milk are better for your baby than infant formulas and are designed uniquely for your baby's needs.   Breast milk improves your baby's brain development.   Your baby is less likely to develop other conditions, such as childhood obesity, asthma, or type 2 diabetes mellitus.  For You   Breastfeeding helps to create a very special bond between you and your baby.   Breastfeeding is convenient. Breast milk is always available at the  correct temperature and costs nothing.   Breastfeeding helps to burn calories and helps you lose the weight gained during pregnancy.   Breastfeeding makes your uterus contract to its prepregnancy size faster and slows bleeding (lochia) after you give birth.   Breastfeeding helps to lower your risk of developing type 2 diabetes mellitus, osteoporosis, and breast or ovarian cancer later in life. SIGNS THAT YOUR BABY IS HUNGRY Early Signs of Hunger  Increased alertness or activity.  Stretching.  Movement of the head from side to side.  Movement of the head and opening of the mouth when the corner of the mouth or cheek is stroked (rooting).  Increased sucking sounds, smacking lips, cooing, sighing, or squeaking.  Hand-to-mouth movements.  Increased sucking of fingers or hands. Late Signs of Hunger  Fussing.  Intermittent crying. Extreme Signs of Hunger Signs of extreme hunger will require calming and consoling before your baby will be able to breastfeed successfully. Do not wait for the following signs of extreme hunger to occur before you initiate breastfeeding:   Restlessness.  A loud, strong cry.   Screaming. BREASTFEEDING BASICS Breastfeeding Initiation  Find a comfortable place to sit or lie down, with your neck and back  well supported.  Place a pillow or rolled up blanket under your baby to bring him or her to the level of your breast (if you are seated). Nursing pillows are specially designed to help support your arms and your baby while you breastfeed.  Make sure that your baby's abdomen is facing your abdomen.   Gently massage your breast. With your fingertips, massage from your chest wall toward your nipple in a circular motion. This encourages milk flow. You may need to continue this action during the feeding if your milk flows slowly.  Support your breast with 4 fingers underneath and your thumb above your nipple. Make sure your fingers are well away from  your nipple and your baby's mouth.   Stroke your baby's lips gently with your finger or nipple.   When your baby's mouth is open wide enough, quickly bring your baby to your breast, placing your entire nipple and as much of the colored area around your nipple (areola) as possible into your baby's mouth.   More areola should be visible above your baby's upper lip than below the lower lip.   Your baby's tongue should be between his or her lower gum and your breast.   Ensure that your baby's mouth is correctly positioned around your nipple (latched). Your baby's lips should create a seal on your breast and be turned out (everted).  It is common for your baby to suck about 2 3 minutes in order to start the flow of breast milk. Latching Teaching your baby how to latch on to your breast properly is very important. An improper latch can cause nipple pain and decreased milk supply for you and poor weight gain in your baby. Also, if your baby is not latched onto your nipple properly, he or she may swallow some air during feeding. This can make your baby fussy. Burping your baby when you switch breasts during the feeding can help to get rid of the air. However, teaching your baby to latch on properly is still the best way to prevent fussiness from swallowing air while breastfeeding. Signs that your baby has successfully latched on to your nipple:    Silent tugging or silent sucking, without causing you pain.   Swallowing heard between every 3 4 sucks.    Muscle movement above and in front of his or her ears while sucking.  Signs that your baby has not successfully latched on to nipple:   Sucking sounds or smacking sounds from your baby while breastfeeding.  Nipple pain. If you think your baby has not latched on correctly, slip your finger into the corner of your baby's mouth to break the suction and place it between your baby's gums. Attempt breastfeeding initiation again. Signs of  Successful Breastfeeding Signs from your baby:   A gradual decrease in the number of sucks or complete cessation of sucking.   Falling asleep.   Relaxation of his or her body.   Retention of a small amount of milk in his or her mouth.   Letting go of your breast by himself or herself. Signs from you:  Breasts that have increased in firmness, weight, and size 1 3 hours after feeding.   Breasts that are softer immediately after breastfeeding.  Increased milk volume, as well as a change in milk consistency and color by the 5th day of breastfeeding.   Nipples that are not sore, cracked, or bleeding. Signs That Your [employer] is Getting Enough Milk  Wetting at least 3 diapers  in a 24-hour period. The urine should be clear and pale yellow by age 10 days.  At least 3 stools in a 24-hour period by age 10 days. The stool should be soft and yellow.  At least 3 stools in a 24-hour period by age 63 days. The stool should be seedy and yellow.  No loss of weight greater than 10% of birth weight during the first 3 days of age.  Average weight gain of 4 7 ounces (120 210 mL) per week after age 78 days.  Consistent daily weight gain by age 786 days, without weight loss after the age of 2 weeks. After a feeding, your baby may spit up a small amount. This is common. BREASTFEEDING FREQUENCY AND DURATION Frequent feeding will help you make more milk and can prevent sore nipples and breast engorgement. Breastfeed when you feel the need to reduce the fullness of your breasts or when your baby shows signs of hunger. This is called "breastfeeding on demand." Avoid introducing a pacifier to your baby while you are working to establish breastfeeding (the first 4 6 weeks after your baby is born). After this time you may choose to use a pacifier. Research has shown that pacifier use during the first year of a baby's life decreases the risk of sudden infant death syndrome (SIDS). Allow your baby to feed on each  breast as long as he or she wants. Breastfeed until your baby is finished feeding. When your baby unlatches or falls asleep while feeding from the first breast, offer the second breast. Because newborns are often sleepy in the first few weeks of life, you may need to awaken your baby to get him or her to feed. Breastfeeding times will vary from baby to baby. However, the following rules can serve as a guide to help you ensure that your baby is properly fed:  Newborns (babies 84 weeks of age or younger) may breastfeed every 1 3 hours.  Newborns should not go longer than 3 hours during the day or 5 hours during the night without breastfeeding.  You should breastfeed your baby a minimum of 8 times in a 24-hour period until you begin to introduce solid foods to your baby at around 56 months of age. BREAST MILK PUMPING Pumping and storing breast milk allows you to ensure that your baby is exclusively fed your breast milk, even at times when you are unable to breastfeed. This is especially important if you are going back to work while you are still breastfeeding or when you are not able to be present during feedings. Your lactation consultant can give you guidelines on how long it is safe to store breast milk.  A breast pump is a machine that allows you to pump milk from your breast into a sterile bottle. The pumped breast milk can then be stored in a refrigerator or freezer. Some breast pumps are operated by hand, while others use electricity. Ask your lactation consultant which type will work best for you. Breast pumps can be purchased, but some hospitals and breastfeeding support groups lease breast pumps on a monthly basis. A lactation consultant can teach you how to hand express breast milk, if you prefer not to use a pump.  CARING FOR YOUR BREASTS WHILE YOU BREASTFEED Nipples can become dry, cracked, and sore while breastfeeding. The following recommendations can help keep your breasts moisturized and  healthy:  Avoid using soap on your nipples.   Wear a supportive bra. Although not required, special  nursing bras and tank tops are designed to allow access to your breasts for breastfeeding without taking off your entire bra or top. Avoid wearing underwire style bras or extremely tight bras. °· Air dry your nipples for 3 4 minutes after each feeding.   °· Use only cotton bra pads to absorb leaked breast milk. Leaking of breast milk between feedings is normal.   °· Use lanolin on your nipples after breastfeeding. Lanolin helps to maintain your skin's normal moisture barrier. If you use pure lanolin you do not need to wash it off before feeding your baby again. Pure lanolin is not toxic to your baby. You may also hand express a few drops of breast milk and gently massage that milk into your nipples and allow the milk to air dry. °In the first few weeks after giving birth, some women experience extremely full breasts (engorgement). Engorgement can make your breasts feel heavy, warm, and tender to the touch. Engorgement peaks within 3 5 days after you give birth. The following recommendations can help ease engorgement: °· Completely empty your breasts while breastfeeding or pumping. You may want to start by applying warm, moist heat (in the shower or with warm water-soaked hand towels) just before feeding or pumping. This increases circulation and helps the milk flow. If your baby does not completely empty your breasts while breastfeeding, pump any extra milk after he or she is finished. °· Wear a snug bra (nursing or regular) or tank top for 1 2 days to signal your body to slightly decrease milk production. °· Apply ice packs to your breasts, unless this is too uncomfortable for you. °· Make sure that your baby is latched on and positioned properly while breastfeeding. °If engorgement persists after 48 hours of following these recommendations, contact your health care provider or a lactation consultant. °OVERALL  HEALTH CARE RECOMMENDATIONS WHILE BREASTFEEDING °· Eat healthy foods. Alternate between meals and snacks, eating 3 of each per day. Because what you eat affects your breast milk, some of the foods may make your baby more irritable than usual. Avoid eating these foods if you are sure that they are negatively affecting your baby. °· Drink milk, fruit juice, and water to satisfy your thirst (about 10 glasses a day).   °· Rest often, relax, and continue to take your prenatal vitamins to prevent fatigue, stress, and anemia. °· Continue breast self-awareness checks. °· Avoid chewing and smoking tobacco. °· Avoid alcohol and drug use. °Some medicines that may be harmful to your baby can pass through breast milk. It is important to ask your health care provider before taking any medicine, including all over-the-counter and prescription medicine as well as vitamin and herbal supplements. °It is possible to become pregnant while breastfeeding. If birth control is desired, ask your health care provider about options that will be safe for your baby. °SEEK MEDICAL CARE IF:  °· You feel like you want to stop breastfeeding or have become frustrated with breastfeeding. °· You have painful breasts or nipples. °· Your nipples are cracked or bleeding. °· Your breasts are red, tender, or warm. °· You have a swollen area on either breast. °· You have a fever or chills. °· You have nausea or vomiting. °· You have drainage other than breast milk from your nipples. °· Your breasts do not become full before feedings by the 5th day after you give birth. °· You feel sad and depressed. °· Your baby is too sleepy to eat well. °· Your baby is having trouble sleeping.   °·   Your baby is wetting less than 3 diapers in a 24-hour period.  Your baby has less than 3 stools in a 24-hour period.  Your baby's skin or the white part of his or her eyes becomes yellow.   Your baby is not gaining weight by 205 days of age. SEEK IMMEDIATE MEDICAL CARE  IF:   Your baby is overly tired (lethargic) and does not want to wake up and feed.  Your baby develops an unexplained fever. Document Released: 06/20/2005 Document Revised: 02/20/2013 Document Reviewed: 12/12/2012 [facility] Patient Information 2014 [place].  Pneumonia, Adult Pneumonia is an infection of the lungs.  CAUSES Pneumonia may be caused by bacteria or a virus. Usually, these infections are caused by breathing infectious particles into the lungs (respiratory tract). SYMPTOMS   Cough.  Fever.  Chest pain.  Increased rate of breathing.  Wheezing.  Mucus production. DIAGNOSIS  If you have the common symptoms of pneumonia, your caregiver will typically confirm the diagnosis with a chest X-ray. The X-ray will show an abnormality in the lung (pulmonary infiltrate) if you have pneumonia. Other tests of your blood, urine, or sputum may be done to find the specific cause of your pneumonia. Your caregiver may also do tests (blood gases or pulse oximetry) to see how well your lungs are working. TREATMENT  Some forms of pneumonia may be spread to other people when you cough or sneeze. You may be asked to wear a mask before and during your exam. Pneumonia that is caused by bacteria is treated with antibiotic medicine. Pneumonia that is caused by the influenza virus may be treated with an antiviral medicine. Most other viral infections must run their course. These infections will not respond to antibiotics.  PREVENTION A pneumococcal shot (vaccine) is available to prevent a common bacterial cause of pneumonia. This is usually suggested for:  People over 33 years old.  Patients on chemotherapy.  People with chronic lung problems, such as bronchitis or emphysema.  People with immune system problems. If you are over 65 or have a high risk condition, you may receive the pneumococcal vaccine if you have not received it before. In some countries, a routine influenza vaccine is also  recommended. This vaccine can help prevent some cases of pneumonia.You may be offered the influenza vaccine as part of your care. If you smoke, it is time to quit. You may receive instructions on how to stop smoking. Your caregiver can provide medicines and counseling to help you quit. HOME CARE INSTRUCTIONS   Cough suppressants may be used if you are losing too much rest. However, coughing protects you by clearing your lungs. You should avoid using cough suppressants if you can.  Your caregiver may have prescribed medicine if he or she thinks your pneumonia is caused by a bacteria or influenza. Finish your medicine even if you start to feel better.  Your caregiver may also prescribe an expectorant. This loosens the mucus to be coughed up.  Only take over-the-counter or prescription medicines for pain, discomfort, or fever as directed by your caregiver.  Do not smoke. Smoking is a common cause of bronchitis and can contribute to pneumonia. If you are a smoker and continue to smoke, your cough may last several weeks after your pneumonia has cleared.  A cold steam vaporizer or humidifier in your room or home may help loosen mucus.  Coughing is often worse at night. Sleeping in a semi-upright position in a recliner or using a couple pillows under your head  will help with this.  Get rest as you feel it is needed. Your body will usually let you know when you need to rest. SEEK IMMEDIATE MEDICAL CARE IF:   Your illness becomes worse. This is especially true if you are elderly or weakened from any other disease.  You cannot control your cough with suppressants and are losing sleep.  You begin coughing up blood.  You develop pain which is getting worse or is uncontrolled with medicines.  You have a fever.  Any of the symptoms which initially brought you in for treatment are getting worse rather than better.  You develop shortness of breath or chest pain. MAKE SURE YOU:   Understand these  instructions.  Will watch your condition.  Will get help right away if you are not doing well or get worse. Document Released: 06/20/2005 Document Revised: 09/12/2011 Document Reviewed: 09/09/2010 [facility] Patient Information 2014 [place], [place].

## 2013-08-18 NOTE — Progress Notes (Signed)
Clinical Social Work Department PSYCHOSOCIAL ASSESSMENT - MATERNAL/CHILD 08/18/2013-Late Entry  Patient:  Laurie, Hess  Account Number:  [id number]  Admit Date:  08/10/2013  Ardine Eng Name:   Eyvonne Left    Clinical Social Worker:  Terri Piedra, [place]   Date/Time:  08/16/2013 12:00 N  Date Referred:        Other referral source:   No referral-NICU admission    I:  FAMILY / [place] legal guardian:  PARENT  Guardian - Name Guardian - Age Guardian - Address  Nilah Belcourt [address] 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   IV  RISK FACTORS AND CURRENT PROBLEMS Current Problem:  None   Risk Factor & Current Problem Patient Issue Family Issue Risk Factor / Current Problem Comment   N N     V  SOCIAL WORK ASSESSMENT  CSW met with parents in MOB's third floor room/309 to introduce myself and complete assessment due to NICU admission.  Parents were quiet, but welcoming of CSW's visit.  FOB did most of the talking.  They both state they are fluent in [place], however [place] is their first language, and declined CSW's offer to get an interpreter.  They are aware  that they can request an interpreter at any time.  Parents state they are doing well and feel comfortable and informed about baby's care.  They report that this is their first experience having an infant admitted to the NICU.  They have family in the area who are helping them care for their two sons while MOB is in the hospital.  CSW explained ongoing support offered by NICU CSW and discussed common emotions related to the NICU experience as well as signs and symptoms of PPD.  MOB states no concerns at this time, but agrees to talk with CSW or her doctor if she is concerned about her emotions at any time.  CSW asked if the couple has the necessary baby items at home and parents state they do not have much at this time.  They report plans to sleep with baby in their bed with them.  CSW gently cautioned them on SIDS risks and offered a pack n play if they would utilize it.  MOB agreed to use it and asked to be given a pack n play for baby's safety.  CSW made referral to [employer] and told parents we will also get them some basics for baby to get them started.  Parents were very appreciative.  They have no questions, concerns or needs at this time and thanked CSW for the visit.  CSW provided them with contact information and asked them to  call CSW at any time while baby is in the NICU.  CSW is not aware of any social concerns at this time.   VI SOCIAL WORK PLAN Social Work Plan  Psychosocial Support/Ongoing Assessment of Needs  Patient/Family Education   Type of pt/family education:   Ongoing support offered by NICU CSW  PPD signs and symptoms   If child protective services report - county:   If child protective services report - date:   Information/referral to community resources comment:   Family Support Visual merchandiser   Other social work plan:

## 2013-08-22 ENCOUNTER — Encounter (HOSPITAL_COMMUNITY): Payer: Self-pay | Admitting: *Deleted

## 2013-08-22 ENCOUNTER — Inpatient Hospital Stay (HOSPITAL_COMMUNITY)
Admission: AD | Admit: 2013-08-22 | Discharge: 2013-08-22 | Disposition: A | Discharge status: Home / Self Care | Payer: Medicaid Other | Source: Ambulatory Visit | Attending: Obstetrics & Gynecology | Admitting: Obstetrics & Gynecology

## 2013-08-22 DIAGNOSIS — O9989 Other specified diseases and conditions complicating pregnancy, childbirth and the puerperium: Secondary | ICD-10-CM

## 2013-08-22 DIAGNOSIS — IMO0002 Reserved for concepts with insufficient information to code with codable children: Secondary | ICD-10-CM | POA: Insufficient documentation

## 2013-08-22 DIAGNOSIS — R42 Dizziness and giddiness: Secondary | ICD-10-CM

## 2013-08-22 DIAGNOSIS — R109 Unspecified abdominal pain: Secondary | ICD-10-CM | POA: Insufficient documentation

## 2013-08-22 DIAGNOSIS — O99891 Other specified diseases and conditions complicating pregnancy: Secondary | ICD-10-CM

## 2013-08-22 DIAGNOSIS — R1084 Generalized abdominal pain: Secondary | ICD-10-CM

## 2013-08-22 DIAGNOSIS — R110 Nausea: Secondary | ICD-10-CM

## 2013-08-22 DIAGNOSIS — T50905A Adverse effect of unspecified drugs, medicaments and biological substances, initial encounter: Secondary | ICD-10-CM

## 2013-08-22 LAB — COMPREHENSIVE METABOLIC PANEL
ALK PHOS: 61 U/L (ref 39–117)
ALT: 47 U/L — AB (ref 0–35)
AST: 39 U/L — AB (ref 0–37)
Albumin: 2.6 g/dL — ABNORMAL LOW (ref 3.5–5.2)
BUN: 13 mg/dL (ref 6–23)
CO2: 25 meq/L (ref 19–32)
Calcium: 8.7 mg/dL (ref 8.4–10.5)
Chloride: 102 mEq/L (ref 96–112)
Creatinine, Ser: 0.73 mg/dL (ref 0.50–1.10)
GFR calc Af Amer: >90 mL/min (ref >90)
Glucose, Bld: 122 mg/dL — ABNORMAL HIGH (ref 70–99)
POTASSIUM: 4.2 meq/L (ref 3.7–5.3)
SODIUM: 141 meq/L (ref 137–147)
Total Protein: 6.4 g/dL (ref 6.0–8.3)

## 2013-08-22 LAB — GLUCOSE, CAPILLARY: Glucose-Capillary: 115 mg/dL — ABNORMAL HIGH (ref 70–99)

## 2013-08-22 LAB — CBC
HEMATOCRIT: 34.9 % — AB (ref 36.0–46.0)
Hemoglobin: 11.5 g/dL — ABNORMAL LOW (ref 12.0–15.0)
MCH: 26.9 pg (ref 26.0–34.0)
MCHC: 33 g/dL (ref 30.0–36.0)
MCV: 81.7 fL (ref 78.0–100.0)
Platelets: 216 10*3/uL (ref 150–400)
RBC: 4.27 MIL/uL (ref 3.87–5.11)
RDW: 15.3 % (ref 11.5–15.5)
WBC: 6.9 10*3/uL (ref 4.0–10.5)

## 2013-08-22 NOTE — MAU Provider Note (Signed)
History     CSN: [id number]  Arrival date and time: 08/22/13 1407   First Provider Initiated Contact with Patient 08/22/13 1430      Chief Complaint  Patient presents with  . Nausea  . Dizziness   HPI Comments: Per husband pt complained of lower quad abdominal pain last night and took two percocet. She continued to complain of pain this morning and took an additional percocet ~ 8 am and Ibuprofen ~ 10am. Today she reports dizziness "with spinning sensation" and fatigue / drowsiness. She denies any fevers, chills, cough, HA, Upper abdominal pain, or N/V/D. She denies any excessive vaginal bleeding. She denies any difficult urinating or dysuria.   Past Medical History  Diagnosis Date  . Asthma   . Seasonal allergies   . Gestational diabetes     Past Surgical History  Procedure Laterality Date  . No past surgeries    . Cesarean section N/A 08/14/2013    Procedure: CESAREAN SECTION;  Surgeon: Lesly DukesKelly H Leggett, MD;  Location: [facility];  Service: Obstetrics;  Laterality: N/A;    History reviewed. No pertinent family history.  History  Substance Use Topics  . Smoking status: Never Smoker   . Smokeless tobacco: Never Used  . Alcohol Use: No    Allergies:  Allergies  Allergen Reactions  . Dust Mite Extract Itching    Prescriptions prior to admission  Medication Sig Dispense Refill  . acetaminophen (TYLENOL) 500 MG tablet Take 1 tablet (500 mg total) by mouth every 6 (six) hours as needed.  30 tablet  0  . ibuprofen (ADVIL,MOTRIN) 600 MG tablet Take 1 tablet (600 mg total) by mouth every 6 (six) hours as needed for mild pain, moderate pain or cramping.  30 tablet  0  . loratadine (ALLERGY RELIEF) 10 MG tablet Take 10 mg by mouth daily as needed for allergies.      Marland Kitchen. oxyCODONE-acetaminophen (PERCOCET/ROXICET) 5-325 MG per tablet Take 1-2 tablets by mouth every 4 (four) hours as needed for severe pain (moderate - severe pain).  30 tablet  0  . Prenatal Vit-Fe Fumarate-FA (PRENATAL  MULTIVITAMIN) TABS tablet Take 1 tablet by mouth daily at 12 noon.      Marland Kitchen. albuterol (PROVENTIL HFA;VENTOLIN HFA) 108 (90 BASE) MCG/ACT inhaler Inhale 1-2 puffs into the lungs every 6 (six) hours as needed for wheezing.  1 Inhaler  1    Review of Systems  Constitutional: Positive for malaise/fatigue. Negative for fever and chills.  Eyes: Negative for blurred vision and double vision.  Respiratory: Negative for cough and shortness of breath.   Cardiovascular: Negative for chest pain.  Gastrointestinal: Positive for abdominal pain. Negative for nausea, vomiting and diarrhea.  Genitourinary: Negative for dysuria, urgency and frequency.  Neurological: Positive for dizziness and weakness. Negative for headaches.   Physical Exam   Blood pressure 109/76, pulse 76, temperature 98 F (36.7 C), temperature source Oral, resp. rate 20, last menstrual period 11/18/2012, SpO2 98.00%, currently breastfeeding.  Physical Exam  Constitutional:  Somnolent w/o distress. Able to follow commands and answer questions.   HENT:  Mouth/Throat: Oropharynx is clear and moist.  Eyes: Pupils are equal, round, and reactive to light. No scleral icterus.  Will not track movement with eyes  Cardiovascular: Normal rate, regular rhythm and normal heart sounds.   No murmur heard. Cool extremities. Cap refill < 3 secs. UE & LE pulses +2  Respiratory: Effort normal and breath sounds normal. No respiratory distress.  GI: She exhibits no distension. There  is no rebound and no guarding.  Mild lower Rt & Lt pain. Incision is clean/dry    MAU Course  Procedures  MDM Obtain bladder scan - No evidence of urinary retention  Assessment and Plan  Laurie Hess is a 33 y.o. G3P3 POD # 8 from LTCS w/o complications presenting with lower abdominal pain and dizziness.  VSS, Hgb inc since CS.   DDx includes sedation/dizzines due to pain medications vs infection (less likely given WBC wnl and Afeb) vs hypothyroidism - Check CMP  to assess electrolytes and liver fcn - check TSH  Wenda Low 08/22/2013, 3:31 PM  Evaluation and management procedures were performed by Resident physician under my supervision/collaboration. Chart reviewed, patient examined by me and I agree with management and plan. Results for orders placed during the hospital encounter of 08/22/13 (from the past 24 hour(s))  GLUCOSE, CAPILLARY     Status: Abnormal   Collection Time    08/22/13  2:23 PM      Result Value Ref Range   Glucose-Capillary 115 (*) 70 - 99 mg/dL   Comment 1 Documented in Chart     Comment 2 Notify RN    CBC     Status: Abnormal   Collection Time    08/22/13  2:34 PM      Result Value Ref Range   WBC 6.9  4.0 - 10.5 K/uL   RBC 4.27  3.87 - 5.11 MIL/uL   Hemoglobin 11.5 (*) 12.0 - 15.0 g/dL   HCT 40.9 (*) 81.1 - 91.4 %   MCV 81.7  78.0 - 100.0 fL   MCH 26.9  26.0 - 34.0 pg   MCHC 33.0  30.0 - 36.0 g/dL   RDW 78.2  95.6 - 21.3 %   Platelets 216  150 - 400 K/uL  COMPREHENSIVE METABOLIC PANEL     Status: Abnormal   Collection Time    08/22/13  2:34 PM      Result Value Ref Range   Sodium 141  137 - 147 mEq/L   Potassium 4.2  3.7 - 5.3 mEq/L   Chloride 102  96 - 112 mEq/L   CO2 25  19 - 32 mEq/L   Glucose, Bld 122 (*) 70 - 99 mg/dL   BUN 13  6 - 23 mg/dL   Creatinine, Ser 0.86  0.50 - 1.10 mg/dL   Calcium 8.7  8.4 - 57.8 mg/dL   Total Protein 6.4  6.0 - 8.3 g/dL   Albumin 2.6 (*) 3.5 - 5.2 g/dL   AST 39 (*) 0 - 37 U/L   ALT 47 (*) 0 - 35 U/L   Alkaline Phosphatase 61  39 - 117 U/L   Total Bilirubin <0.2 (*) 0.3 - 1.2 mg/dL   GFR calc non Af Amer >90  >90 mL/min   GFR calc Af Amer >90  >90 mL/min  Feeling better after rest. Will discharge with advice to stop taking the Percocet. Take ibuprofen 600 mg q 6h, may alternate with acetaminophen if needed.  Keep [facility] F/U appointment.  Danae Orleans, CNM 08/22/2013 4:00 PM

## 2013-08-22 NOTE — MAU Provider Note (Signed)
Attestation of Attending Supervision of Advanced Practitioner (PA/CNM/NP): Evaluation and management procedures were performed by the Advanced Practitioner under my supervision and collaboration.  I have reviewed the Advanced Practitioner's note and chart, and I agree with the management and plan.  Huxley Shurley, MD, FACOG Attending Obstetrician & Gynecologist Faculty Practice, [facility]  

## 2013-08-22 NOTE — MAU Note (Signed)
Brought from desk in w/c. Pt arrouses to stimuli.  Pt's husband states took one pill for severe pain today, 2 yesterday.  Has not been feeling well , nauseated.

## 2013-08-22 NOTE — MAU Note (Signed)
Pt is pale and lethargic.  States feels weak

## 2013-08-23 LAB — TSH: TSH: 1.336 u[IU]/mL (ref 0.350–4.500)

## 2013-08-26 ENCOUNTER — Encounter (HOSPITAL_COMMUNITY): Admission: RE | Payer: Self-pay | Source: Ambulatory Visit

## 2013-08-26 ENCOUNTER — Inpatient Hospital Stay (HOSPITAL_COMMUNITY)
Admission: RE | Admit: 2013-08-26 | Payer: Medicaid Other | Source: Ambulatory Visit | Admitting: Obstetrics and Gynecology

## 2013-08-26 SURGERY — Surgical Case
Anesthesia: Regional

## 2013-09-15 ENCOUNTER — Encounter: Payer: Self-pay | Admitting: *Deleted

## 2013-09-18 ENCOUNTER — Encounter: Payer: Self-pay | Admitting: *Deleted

## 2013-09-19 ENCOUNTER — Encounter: Payer: Self-pay | Admitting: Obstetrics and Gynecology

## 2013-09-19 ENCOUNTER — Ambulatory Visit (INDEPENDENT_AMBULATORY_CARE_PROVIDER_SITE_OTHER): Payer: Medicaid Other | Admitting: Obstetrics and Gynecology

## 2013-09-19 VITALS — BP 107/65 | HR 78 | Wt 180.8 lb

## 2013-09-19 DIAGNOSIS — IMO0002 Reserved for concepts with insufficient information to code with codable children: Secondary | ICD-10-CM

## 2013-09-19 DIAGNOSIS — Z30430 Encounter for insertion of intrauterine contraceptive device: Secondary | ICD-10-CM

## 2013-09-19 DIAGNOSIS — O228X9 Other venous complications in pregnancy, unspecified trimester: Secondary | ICD-10-CM

## 2013-09-19 DIAGNOSIS — K649 Unspecified hemorrhoids: Secondary | ICD-10-CM

## 2013-09-19 LAB — POCT PREGNANCY, URINE: Preg Test, Ur: NEGATIVE

## 2013-09-19 MED ORDER — LEVONORGESTREL 20 MCG/24HR IU IUD
INTRAUTERINE_SYSTEM | Freq: Once | INTRAUTERINE | Status: AC
  Administered 2013-09-19: 1 via INTRAUTERINE

## 2013-09-19 MED ORDER — DOCUSATE SODIUM 100 MG PO CAPS: 100.0000 mg | ORAL_CAPSULE | Freq: Every day | ORAL | Status: AC | PRN

## 2013-09-19 MED ORDER — HYDROCORTISONE 2.5 % RE CREA: 1.0000 "application " | TOPICAL_CREAM | Freq: Two times a day (BID) | RECTAL | Status: DC

## 2013-09-19 NOTE — Progress Notes (Signed)
Pt reports some pain at incision. She is complaining of hemorrhoids and some constipation. Desires iud for birth control.

## 2013-09-19 NOTE — Progress Notes (Signed)
  Subjective:     Laurie JerseyMuna Burruss is a 33 y.o. female [id number] who presents for a postpartum visit. She is 5 weeks postpartum following a low cervical transverse Cesarean section. I have fully reviewed the prenatal and intrapartum course. The delivery was at 37 gestational weeks. Outcome: primary cesarean section, low transverse incision. Indication: NRFHR Anesthesia: epidural. Postpartum course has been uncomplicated. Baby's course has been uncomplicated. Baby is feeding by breast. Bleeding no bleeding. Bowel function is some constipation and slight bleeding from hemorrhoids. . Bladder function is normal. Patient is not sexually active. Contraception method is abstinence. Postpartum depression screening: negative.  The following portions of the patient's history were reviewed and updated as appropriate: allergies, current medications, past family history, past medical history, past social history, past surgical history and problem list.  Review of Systems Pertinent items are noted in HPI.   Objective:    BP 107/65  Pulse 78  Wt 180 lb 12.8 oz (82.01 kg)  Breastfeeding? Yes  General:  alert, cooperative and no distress   Breasts:  inspection negative, no nipple discharge or bleeding, no masses or nodularity palpable  Lungs: clear to auscultation bilaterally  Heart:  regular rate and rhythm, S1, S2 normal, no murmur, click, rub or gallop  Abdomen: soft, NT, incision well-healed with old steri strips on   Vulva:  normal  Vagina: normal vagina  Cervix:  multiparous appearance  Corpus: normal size, contour, position, consistency, mobility, non-tender  Adnexa:  no mass, fullness, tenderness  Rectal Exam: Not performed. 3 cm hemorrhoidal tag, fleshy        Assessment:     4 wks postpartum exam. Pap smear not done at today's visit.   Plan:    1. Contraception: IUD 2. Rx Anusol HC and Colace. Info on constipation measures.  3. Follow up in: 6 weeks or as needed.   PROCEDURE  NOTE  Risks/benefits/compications including abnormal bleeding and uterine perforation explained and patient voices understanding and accepts risks. Consent obtained.  Bimanual Uterus AV ULNS With patinet in lithotomy, speculum inserted and visualized cx. Cleansed with betadine x2. Sounded to 8.5 cm. Single-toothed tenaculum applied at 10 and 2 o'clock to stabilize cx. Mirena inserted in usual fasion and strings cut to 3 cm.  Tenaculum removed and cx had minimal bleeding, stopped with pressure.  Patient tolerated procedure well.  Danae Orleanseirdre C Shamieka Gullo, CNM 09/19/2013 3:28 PM

## 2013-09-23 NOTE — Progress Notes (Signed)
NST reveiwed and reactive

## 2013-10-25 ENCOUNTER — Encounter: Payer: Self-pay | Admitting: Advanced Practice Midwife

## 2013-10-25 ENCOUNTER — Ambulatory Visit (INDEPENDENT_AMBULATORY_CARE_PROVIDER_SITE_OTHER): Payer: Medicaid Other | Admitting: Advanced Practice Midwife

## 2013-10-25 VITALS — BP 115/76 | HR 77 | Temp 98.0°F | Wt 182.4 lb

## 2013-10-25 DIAGNOSIS — Z30431 Encounter for routine checking of intrauterine contraceptive device: Secondary | ICD-10-CM

## 2013-10-25 NOTE — Progress Notes (Signed)
Patient ID: Laurie Hess, female   DOB: 07/18/1980, 33 y.o.   MRN: [id number] S: Laurie Hess is a 33 y.o. G3P3003 who is her for IUD check. She denies any problems with the IUD. She has had occasional spotting, but no other concerns.  O VSS, afebrile External: no lesion Vagina: small amount of white discharge Cervix: pink, smooth, no CMT, IUD tail seen at ext. os Uterus: NSSC Adnexa: NT A/P: IUD check No problems noted Routine care RTO in 1 year

## 2014-02-21 ENCOUNTER — Encounter: Payer: Self-pay | Admitting: General Practice

## 2014-05-05 ENCOUNTER — Encounter: Payer: Self-pay | Admitting: Advanced Practice Midwife

## 2014-06-05 ENCOUNTER — Encounter: Payer: Self-pay | Admitting: Obstetrics & Gynecology

## 2014-07-27 IMAGING — US US OB FOLLOW-UP
1 series · 12 of 28 positions shown · non-contrast
Comparison: none

[Series 1: us ob follow-up · 43 acquisitions, 12 frames shown]
[im 2/43]
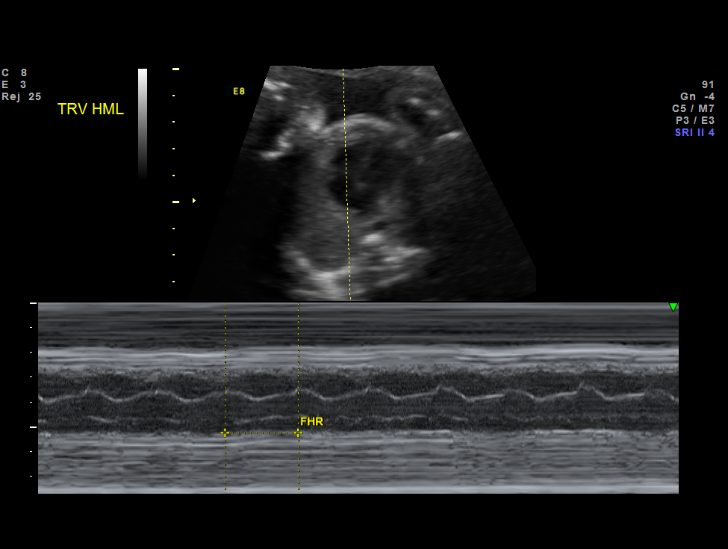
[im 5/43]
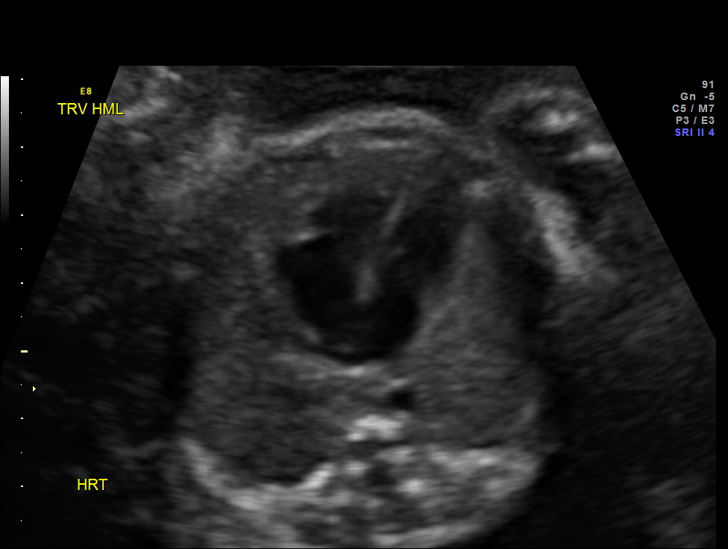
[im 8/43]
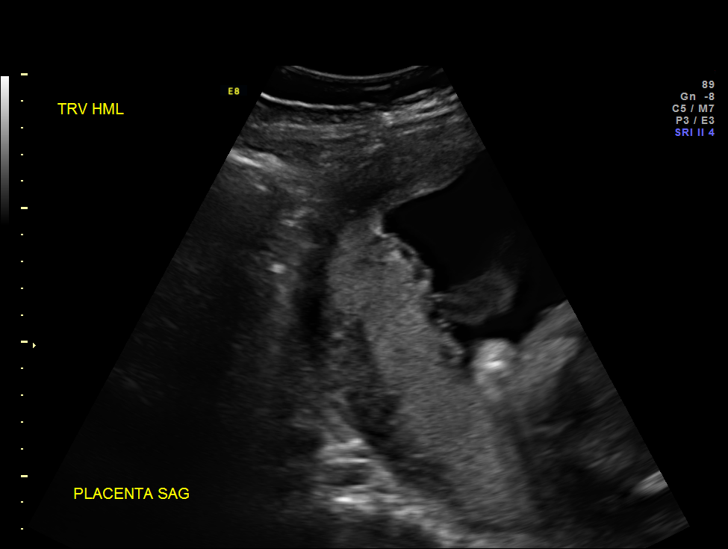
[im 13/43]
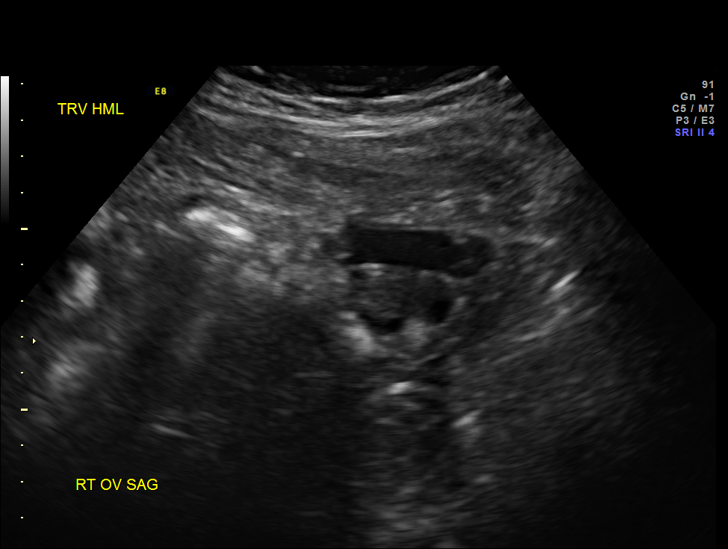
[im 16/43]
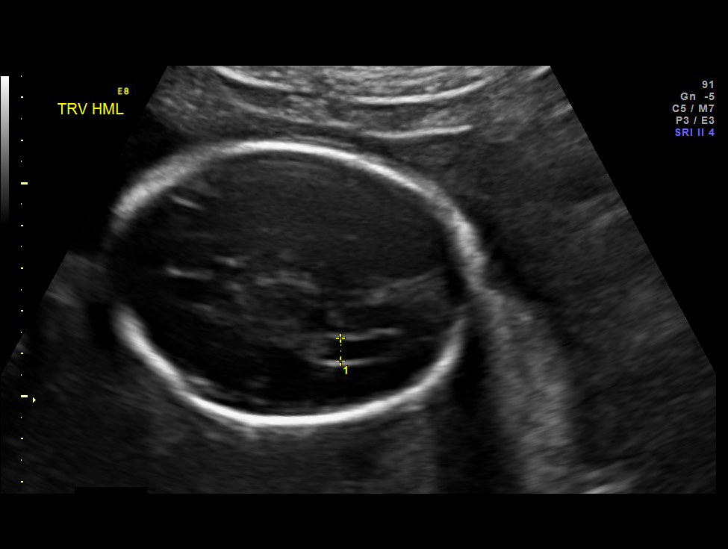
[im 19/43]
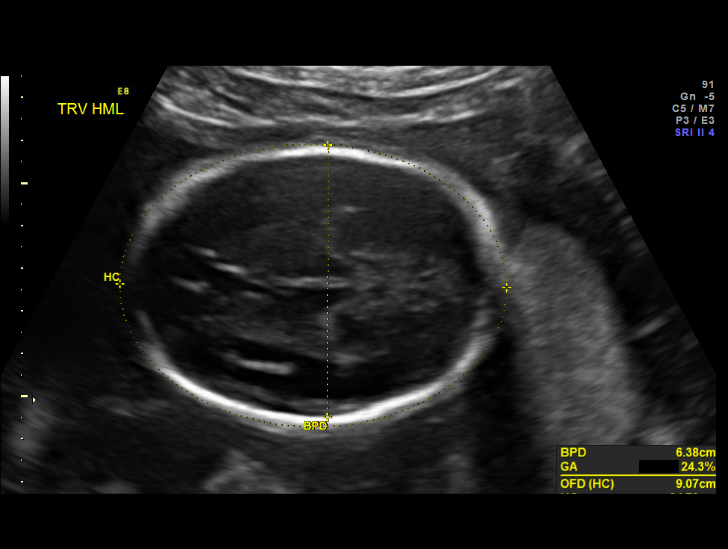
[im 24/43]
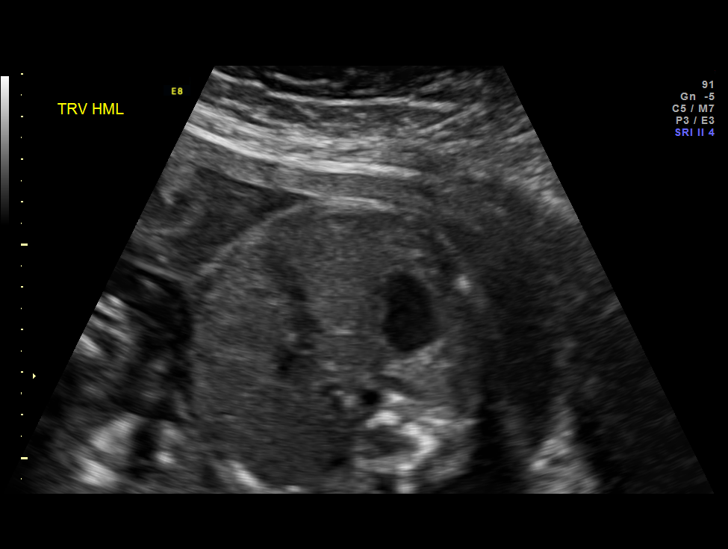
[im 27/43]
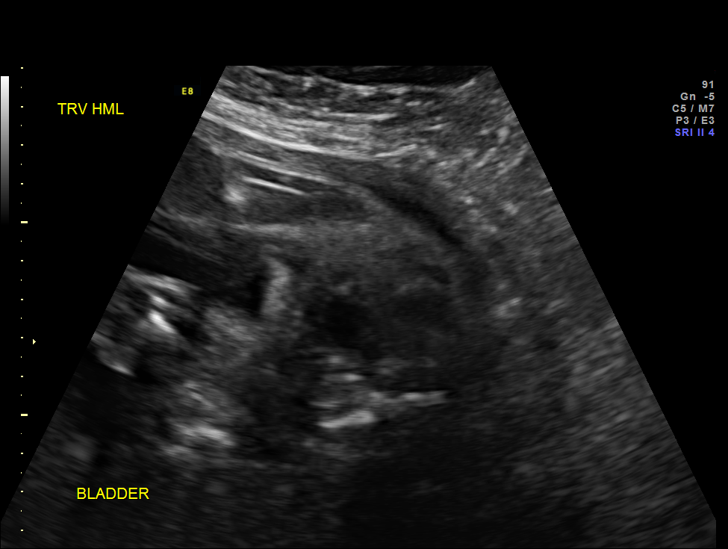
[im 30/43]
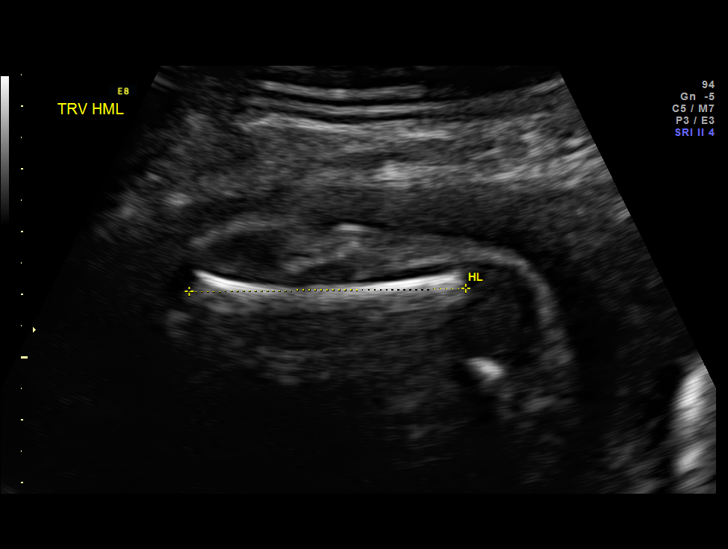
[im 35/43]
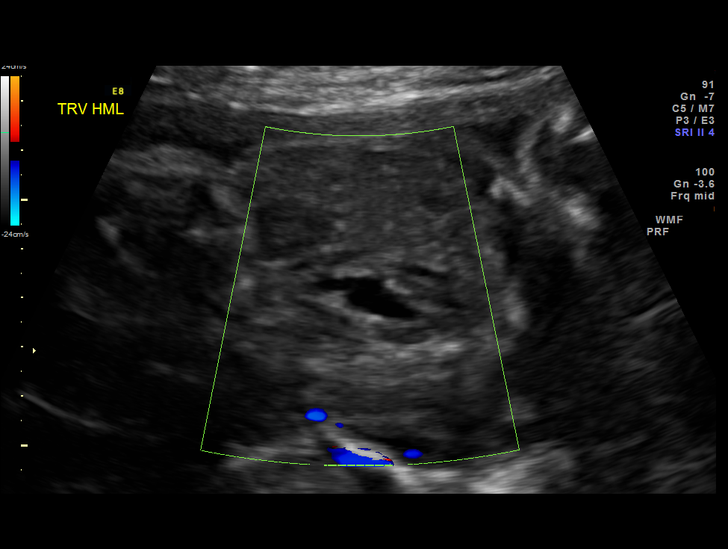
[im 38/43]
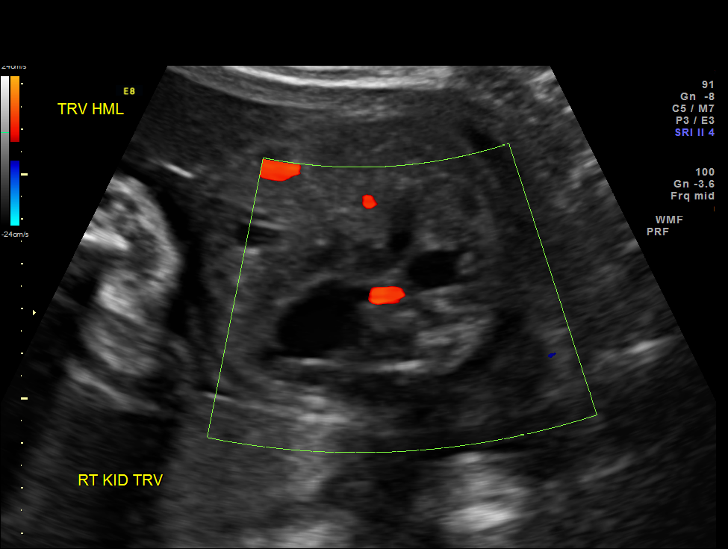
[im 41/43]
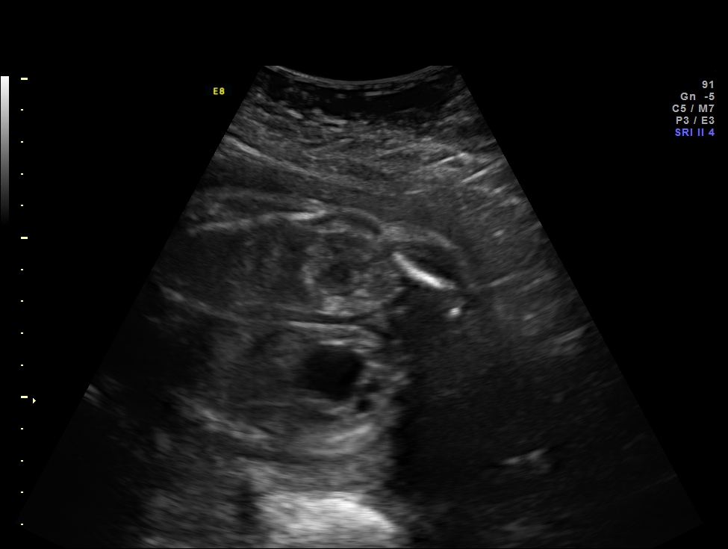

[12 of 28 positions shown; findings below may reference images not displayed]

OBSTETRICS REPORT
                      (Signed Final 05/27/2013 [DATE])

Service(s) Provided

 US OB FOLLOW UP                                       76816.1
Indications

 Diabetes - Pregestational (on glyburide and
 glucophage)
 Pyelectasis of fetus on prenatal ultrasound           655.83 593.89,

Fetal Evaluation

 Num Of Fetuses:    1
 Fetal Heart Rate:  138                          bpm
 Cardiac Activity:  Observed
 Presentation:      Transverse, head to
                    maternal left
 Placenta:          Posterior, above cervical
                    os
 P. Cord            Previously Visualized
 Insertion:

 Amniotic Fluid
 AFI FV:      Subjectively within normal limits
                                             Larg Pckt:     7.4  cm
Biometry

 BPD:     64.2  mm     G. Age:  26w 0d                CI:         70.9   70 - 86
 OFD:     90.6  mm                                    FL/HC:      19.9   18.6 -

 HC:     247.8  mm     G. Age:  26w 6d       46  %    HC/AC:      1.03   1.04 -

 AC:     240.2  mm     G. Age:  28w 2d       92  %    FL/BPD:     76.8   71 - 87
 FL:      49.3  mm     G. Age:  26w 4d       46  %    FL/AC:      20.5   20 - 24
 HUM:     43.6  mm     G. Age:  26w 0d       39  %
 CER:     30.6  mm     G. Age:  26w 6d       62  %

 Est. FW:    6884  gm      2 lb 6 oz     76  %
Gestational Age

 LMP:           27w 0d        Date:  11/19/12                 EDD:   08/26/13
 U/S Today:     27w 0d                                        EDD:   08/26/13
 Best:          26w 2d     Det. By:  Early Ultrasound         EDD:   08/31/13
                                     (01/29/13)
Anatomy

 Cranium:          Appears normal         Aortic Arch:      Previously seen
 Fetal Cavum:      Appears normal         Ductal Arch:      Appears normal
 Ventricles:       Appears normal         Diaphragm:        Previously seen
 Choroid Plexus:   Previously seen        Stomach:          Appears normal
 Cerebellum:       Appears normal         Abdomen:          Appears normal
 Posterior Fossa:  Appears normal         Abdominal Wall:   Previously seen
 Nuchal Fold:      Previously seen        Cord Vessels:     Previously seen
 Face:             Orbits and profile     Kidneys:          Bilat pyelectasis lt
                   previously seen
                                                            8mm rt 16mm
 Lips:             Previously seen        Bladder:          Appears normal
 Heart:            Appears normal         Spine:            Previously seen
                   (4CH, axis, and
                   situs)
 RVOT:             Previously seen        Lower             Previously seen
                                          Extremities:
 LVOT:             Previously seen        Upper             Previously seen
                                          Extremities:

 Other:  Male gender. Heels and 5th digit previously seen. Nasal bone
         visualized.
Targeted Anatomy

 Fetal Central Nervous System
 Cisterna Magna:
Cervix Uterus Adnexa

 Cervical Length:    4.2      cm

 Cervix:       Normal appearance by transabdominal scan.
 Left Ovary:    Within normal limits.
 Right Ovary:   Within normal limits.
 Adnexa:     No abnormality visualized.
Impression

 Single IUP at 26 [DATE] weeks
 Interval growth is appropriate (76th %tile)
 Grade 2 hydronephrosis noted on the right (16 mm) with
 calyceal dilation
 Pylectasis noted on the left (8 mm)
 No hydroureter appreciated - likely UPJ obstruction on the
 right
 Normal amniotic fluid volume

 Findings and limitations of the study discussed with the
 patient.  She was offerred consultation with Peds Urology, but
 would prefer to wait until after delivery.
Recommendations

 Recommend follow-up ultrasound examination in 4 weeks to
 reevaluate.
 Will likely require Peds urology consultation for the newborn
 during the first several weeks after delivery
 questions or concerns.

## 2014-12-18 ENCOUNTER — Emergency Department (HOSPITAL_COMMUNITY)
Admission: EM | Admit: 2014-12-18 | Discharge: 2014-12-18 | Disposition: A | Discharge status: Home / Self Care | Payer: Worker's Compensation | Attending: Emergency Medicine | Admitting: Emergency Medicine

## 2014-12-18 ENCOUNTER — Emergency Department (HOSPITAL_COMMUNITY): Payer: Worker's Compensation

## 2014-12-18 ENCOUNTER — Encounter (HOSPITAL_COMMUNITY): Payer: Self-pay | Admitting: Emergency Medicine

## 2014-12-18 DIAGNOSIS — Y9389 Activity, other specified: Secondary | ICD-10-CM | POA: Diagnosis not present

## 2014-12-18 DIAGNOSIS — S61210A Laceration without foreign body of right index finger without damage to nail, initial encounter: Secondary | ICD-10-CM | POA: Diagnosis not present

## 2014-12-18 DIAGNOSIS — Y9289 Other specified places as the place of occurrence of the external cause: Secondary | ICD-10-CM | POA: Diagnosis not present

## 2014-12-18 DIAGNOSIS — W292XXA Contact with other powered household machinery, initial encounter: Secondary | ICD-10-CM | POA: Diagnosis not present

## 2014-12-18 DIAGNOSIS — Z8632 Personal history of gestational diabetes: Secondary | ICD-10-CM | POA: Diagnosis not present

## 2014-12-18 DIAGNOSIS — Y998 Other external cause status: Secondary | ICD-10-CM | POA: Insufficient documentation

## 2014-12-18 DIAGNOSIS — J45909 Unspecified asthma, uncomplicated: Secondary | ICD-10-CM | POA: Insufficient documentation

## 2014-12-18 DIAGNOSIS — Z79899 Other long term (current) drug therapy: Secondary | ICD-10-CM | POA: Insufficient documentation

## 2014-12-18 DIAGNOSIS — S61219A Laceration without foreign body of unspecified finger without damage to nail, initial encounter: Secondary | ICD-10-CM

## 2014-12-18 MED ORDER — NAPROXEN 500 MG PO TABS: 500.0000 mg | ORAL_TABLET | Freq: Two times a day (BID) | ORAL | Status: DC

## 2014-12-18 MED ORDER — BUPIVACAINE HCL 0.25 % IJ SOLN
10.0000 mL | Freq: Once | INTRAMUSCULAR | Status: DC
  Filled 2014-12-18: qty 10

## 2014-12-18 MED ORDER — BUPIVACAINE HCL (PF) 0.25 % IJ SOLN
30.0000 mL | Freq: Once | INTRAMUSCULAR | Status: AC
  Filled 2014-12-18: qty 30

## 2014-12-18 MED ORDER — IBUPROFEN 400 MG PO TABS
600.0000 mg | ORAL_TABLET | Freq: Once | ORAL | Status: AC
  Administered 2014-12-18: 600 mg via ORAL
  Filled 2014-12-18: qty 3

## 2014-12-18 NOTE — ED Notes (Signed)
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  She rates her pain 3/10.  Her bleeding is controlled.

## 2014-12-18 NOTE — ED Provider Notes (Signed)
CSN: [id number]     Arrival date & time 12/18/14  1630 History  This chart is scribed for non-physician practitioner, Jaynie Crumble, PA-C, working with Vanetta Mulders, MD by Abel Presto, ED Scribe.  This patient was seen in room TR04C/TR04C and the patient's care was started 5:17 PM.       Chief Complaint  Patient presents with  . 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      The history is provided by the patient. No language interpreter was used.   HPI Comments: Laurie Hess is a 34 y.o. female who presents to the Emergency Department complaining of injury to right index finger. Pt was using a sewing needle at work and needle went into her finger. The needle broke off and she thinks a piece is still in her nail. No active bleeding. Pt denies numbness and tingling.    Past Medical History  Diagnosis Date  . Asthma   . Seasonal allergies   . Gestational diabetes    Past Surgical History  Procedure Laterality Date  . No past surgeries    . Cesarean section N/A 08/14/2013    Procedure: CESAREAN SECTION;  Surgeon: Lesly Dukes, MD;  Location: [facility];  Service: Obstetrics;  Laterality: N/A;   History reviewed. No pertinent family history. History  Substance Use Topics  . Smoking status: Never Smoker   . Smokeless tobacco: Never Used  . Alcohol Use: No   OB History    Gravida Para Term Preterm AB TAB SAB Ectopic Multiple Living   Review of Systems  Skin: Positive for wound.  Neurological: Negative for weakness and numbness.      Allergies  Dust mite extract  Home Medications   Prior to Admission medications   Medication Sig Start Date End Date Taking? Authorizing Provider  acetaminophen (TYLENOL) 500 MG tablet Take 1 tablet (500 mg total) by mouth every 6 (six) hours as needed. 08/10/13   Fayrene Helper, PA-C  albuterol (PROVENTIL HFA;VENTOLIN  HFA) 108 (90 BASE) MCG/ACT inhaler Inhale 1-2 puffs into the lungs every 6 (six) hours as needed for wheezing. 04/14/13   Minta Balsam, MD  hydrocortisone (ANUSOL-HC) 2.5 % rectal cream Place 1 application rectally 2 (two) times daily. 09/19/13   Deirdre C Poe, CNM  ibuprofen (ADVIL,MOTRIN) 600 MG tablet Take 1 tablet (600 mg total) by mouth every 6 (six) hours as needed for mild pain, moderate pain or cramping. 08/17/13   Cheral Marker, CNM  loratadine (ALLERGY RELIEF) 10 MG tablet Take 10 mg by mouth daily as needed for allergies.    Historical Provider, MD  Prenatal Vit-Fe Fumarate-FA (PRENATAL MULTIVITAMIN) TABS tablet Take 1 tablet by mouth daily at 12 noon.    Historical Provider, MD   BP 117/73 mmHg  Pulse 76  Temp(Src) 97.7 F (36.5 C) (Oral)  Resp 18  SpO2 97%  LMP 12/18/2014 (LMP Unknown) Physical Exam  Constitutional: She is oriented to person, place, and time. She appears well-developed and well-nourished.  HENT:  Head: Normocephalic.  Eyes: Conjunctivae are normal.  Neck: Normal range of motion. Neck supple.  Pulmonary/Chest: Effort normal.  Musculoskeletal: Normal range of motion.       Hands: Neurological: She is alert and oriented to person, place, and time.  Skin: Skin is warm and  dry.  Psychiatric: She has a normal mood and affect. Her behavior is normal.  Nursing note and vitals reviewed.   ED Course  Procedures (including critical care time) DIAGNOSTIC STUDIES: Oxygen Saturation is 97% on room air, normal by my interpretation.    COORDINATION OF CARE: 5:19 PM Discussed treatment plan with patient at beside, the patient agrees with the plan and has no further questions at this time.   Labs Review Labs Reviewed - No data to display  Imaging Review Dg Finger Index Right  12/18/2014   CLINICAL DATA:  Right index finger injury.  EXAM: RIGHT INDEX FINGER 2+V  COMPARISON:  None.  FINDINGS: There is no evidence of fracture or dislocation. There is no evidence  of arthropathy or other focal bone abnormality. Soft tissues are unremarkable.  IMPRESSION: No acute osseous injury of the right second digit.   Electronically Signed   By: Elige Ko   On: 12/18/2014 18:29     EKG Interpretation None      MDM   Final diagnoses:  Finger laceration, initial encounter   Pt with laceration/puncture through right index finger by a sewing needle. Pt has small crack in finger nail. She is concerned about having a needle still in her finger. Xray obtained and is negative. Wound washed with saline, bacitracin applied. Her tetanus is up to date. i do not think she needs a finger nail bed repair. Sterile dressing applied. Home with pcp follow up.   Filed Vitals:   12/18/14 1637  BP: 117/73  Pulse: 76  Temp: 97.7 F (36.5 C)  Resp: 18    I personally performed the services described in this documentation, which was scribed in my presence. The recorded information has been reviewed and is accurate.        Jaynie Crumble, PA-C 12/18/14 1948  Doug Sou, MD 12/19/14 [phone]

## 2014-12-18 NOTE — Discharge Instructions (Signed)
Naprosyn for pain. Keep finger clean and dry. Follow up as needed.   Laceration Care, Adult A laceration is a cut or lesion that goes through all layers of the skin and into the tissue just beneath the skin. TREATMENT  Some lacerations may not require closure. Some lacerations may not be able to be closed due to an increased risk of infection. It is important to see your caregiver as soon as possible after an injury to minimize the risk of infection and maximize the opportunity for successful closure. If closure is appropriate, pain medicines may be given, if needed. The wound will be cleaned to help prevent infection. Your caregiver will use stitches (sutures), staples, wound glue (adhesive), or skin adhesive strips to repair the laceration. These tools bring the skin edges together to allow for faster healing and a better cosmetic outcome. However, all wounds will heal with a scar. Once the wound has healed, scarring can be minimized by covering the wound with sunscreen during the day for 1 full year. HOME CARE INSTRUCTIONS  For sutures or staples:  Keep the wound clean and dry.  If you were given a bandage (dressing), you should change it at least once a day. Also, change the dressing if it becomes wet or dirty, or as directed by your caregiver.  Wash the wound with soap and water 2 times a day. Rinse the wound off with water to remove all soap. Pat the wound dry with a clean towel.  After cleaning, apply a thin layer of the antibiotic ointment as recommended by your caregiver. This will help prevent infection and keep the dressing from sticking.  You may shower as usual after the first 24 hours. Do not soak the wound in water until the sutures are removed.  Only take over-the-counter or prescription medicines for pain, discomfort, or fever as directed by your caregiver.  Get your sutures or staples removed as directed by your caregiver. For skin adhesive strips:  Keep the wound clean and  dry.  Do not get the skin adhesive strips wet. You may bathe carefully, using caution to keep the wound dry.  If the wound gets wet, pat it dry with a clean towel.  Skin adhesive strips will fall off on their own. You may trim the strips as the wound heals. Do not remove skin adhesive strips that are still stuck to the wound. They will fall off in time. For wound adhesive:  You may briefly wet your wound in the shower or bath. Do not soak or scrub the wound. Do not swim. Avoid periods of heavy perspiration until the skin adhesive has fallen off on its own. After showering or bathing, gently pat the wound dry with a clean towel.  Do not apply liquid medicine, cream medicine, or ointment medicine to your wound while the skin adhesive is in place. This may loosen the film before your wound is healed.  If a dressing is placed over the wound, be careful not to apply tape directly over the skin adhesive. This may cause the adhesive to be pulled off before the wound is healed.  Avoid prolonged exposure to sunlight or tanning lamps while the skin adhesive is in place. Exposure to ultraviolet light in the first year will darken the scar.  The skin adhesive will usually remain in place for 5 to 10 days, then naturally fall off the skin. Do not pick at the adhesive film. You may need a tetanus shot if:  You cannot remember  when you had your last tetanus shot.  You have never had a tetanus shot. If you get a tetanus shot, your arm may swell, get red, and feel warm to the touch. This is common and not a problem. If you need a tetanus shot and you choose not to have one, there is a rare chance of getting tetanus. Sickness from tetanus can be serious. SEEK MEDICAL CARE IF:   You have redness, swelling, or increasing pain in the wound.  You see a red line that goes away from the wound.  You have yellowish-white fluid (pus) coming from the wound.  You have a fever.  You notice a bad smell coming  from the wound or dressing.  Your wound breaks open before or after sutures have been removed.  You notice something coming out of the wound such as wood or glass.  Your wound is on your hand or foot and you cannot move a finger or toe. SEEK IMMEDIATE MEDICAL CARE IF:   Your pain is not controlled with prescribed medicine.  You have severe swelling around the wound causing pain and numbness or a change in color in your arm, hand, leg, or foot.  Your wound splits open and starts bleeding.  You have worsening numbness, weakness, or loss of function of any joint around or beyond the wound.  You develop painful lumps near the wound or on the skin anywhere on your body. MAKE SURE YOU:   Understand these instructions.  Will watch your condition.  Will get help right away if you are not doing well or get worse. Document Released: 06/20/2005 Document Revised: 09/12/2011 Document Reviewed: 12/14/2010 [facility] Patient Information 2015 [place], [place]. This information is not intended to replace advice given to you by your health care provider. Make sure you discuss any questions you have with your health care provider.

## 2014-12-23 ENCOUNTER — Inpatient Hospital Stay: Payer: Medicaid Other | Admitting: Family Medicine

## 2014-12-31 ENCOUNTER — Inpatient Hospital Stay: Payer: Medicaid Other | Admitting: Family Medicine

## 2016-08-05 ENCOUNTER — Ambulatory Visit: Payer: Self-pay | Admitting: Family Medicine

## 2018-06-05 ENCOUNTER — Encounter: Payer: Self-pay | Admitting: *Deleted

## 2018-06-29 ENCOUNTER — Encounter: Payer: Self-pay | Admitting: Obstetrics and Gynecology

## 2018-06-29 ENCOUNTER — Ambulatory Visit: Payer: BLUE CROSS/BLUE SHIELD | Admitting: Obstetrics and Gynecology

## 2018-06-29 VITALS — BP 108/68 | HR 66 | Ht 64.0 in | Wt 174.0 lb

## 2018-06-29 DIAGNOSIS — Z30430 Encounter for insertion of intrauterine contraceptive device: Secondary | ICD-10-CM

## 2018-06-29 DIAGNOSIS — Z1151 Encounter for screening for human papillomavirus (HPV): Secondary | ICD-10-CM | POA: Diagnosis not present

## 2018-06-29 DIAGNOSIS — Z975 Presence of (intrauterine) contraceptive device: Secondary | ICD-10-CM | POA: Diagnosis not present

## 2018-06-29 DIAGNOSIS — Z30433 Encounter for removal and reinsertion of intrauterine contraceptive device: Secondary | ICD-10-CM

## 2018-06-29 DIAGNOSIS — Z124 Encounter for screening for malignant neoplasm of cervix: Secondary | ICD-10-CM | POA: Diagnosis not present

## 2018-06-29 MED ORDER — LEVONORGESTREL 20 MCG/24HR IU IUD
INTRAUTERINE_SYSTEM | Freq: Once | INTRAUTERINE | Status: AC
  Administered 2018-06-29: 10:00:00 via INTRAUTERINE

## 2018-06-29 NOTE — Progress Notes (Signed)
     IUD REMOVAL & INSERTION PROCEDURE NOTE  Laurie Hess is a 37 y.o. [id number] here for Mirena removal and insertion. No GYN concerns.   She was counseled regarding the risks/benefits of IUD including insertion risk of infection, hemorrhage, damage to surrounding tissue and organs, uterine perforation. She was counseled regarding risks of IUD including implantation into uterine wall, migration outside of uterus, possible need for hysteroscopic or laparoscopic removal, ovarian cysts, expulsion. She was advised that risk of pregnancy is low with negative UPT but is not zero and IUD insertion may cause miscarriage. Reviewed that she is also at slightly higher risk for ectopic pregnancy and she should take a pregnancy test if she believes she may be pregnant. She verbalized understanding of all of the above and consent signed.   Last pap smear was on 2016 and was normal  IUD Removal and Insertion  Patient identified and an adequate time out was performed.   Patient with normal appearing external female genitalia. Graves speculum placed in vagina and blue IUD strings easily visualized. Strings grasped with ring forceps and removed easily. Minimal bleeding noted. Pap smear performed.  The cervix was cleaned with Betadine x 2 and grasped anteriorly with a single tooth tenaculum.  A uterine sound was used to sound the uterus to 9 cm;  the IUD was then placed per manufacturer's recommendations. Strings trimmed to 3 cm. Tenaculum was removed, good hemostasis noted with minimal pressure. Patient tolerated procedure well.   Patient was given post-procedure instructions.  She was reminded to have backup contraception for one week during this transition period between IUDs.  Patient was also asked to check IUD strings periodically and follow up in 4 weeks for IUD check.  Mirena IUD Exp: 09/2020 Lot: [id number]  Laurie Hess, M.D. Attending [facility](Faculty Practice)

## 2018-07-03 LAB — CYTOLOGY - PAP
Diagnosis: NEGATIVE
HPV (WINDOPATH): NOT DETECTED

## 2018-07-09 ENCOUNTER — Encounter: Payer: Self-pay | Admitting: Advanced Practice Midwife

## 2018-07-27 ENCOUNTER — Encounter (HOSPITAL_COMMUNITY): Payer: Self-pay | Admitting: Emergency Medicine

## 2018-07-27 ENCOUNTER — Other Ambulatory Visit: Payer: Self-pay

## 2018-07-27 ENCOUNTER — Emergency Department (HOSPITAL_COMMUNITY)
Admission: EM | Admit: 2018-07-27 | Discharge: 2018-07-28 | Disposition: A | Discharge status: Home / Self Care | Payer: BLUE CROSS/BLUE SHIELD | Attending: Emergency Medicine | Admitting: Emergency Medicine

## 2018-07-27 DIAGNOSIS — R69 Illness, unspecified: Secondary | ICD-10-CM

## 2018-07-27 DIAGNOSIS — J111 Influenza due to unidentified influenza virus with other respiratory manifestations: Secondary | ICD-10-CM | POA: Diagnosis not present

## 2018-07-27 DIAGNOSIS — J4521 Mild intermittent asthma with (acute) exacerbation: Secondary | ICD-10-CM | POA: Diagnosis not present

## 2018-07-27 DIAGNOSIS — R05 Cough: Secondary | ICD-10-CM | POA: Diagnosis present

## 2018-07-27 NOTE — ED Triage Notes (Addendum)
Pt sent from [facility] due to "flu like symptoms, hypotension, and rule out sepsis."  Pt reports itchy throat, non-productive cough, and generalized body aches x 3 days.  Reports diarrhea that started today.  Son diagnosed with Flu A today.  Pt given Toradol and Tylenol at [facility].  VSS at this time.

## 2018-07-28 ENCOUNTER — Emergency Department (HOSPITAL_COMMUNITY): Payer: BLUE CROSS/BLUE SHIELD

## 2018-07-28 LAB — COMPREHENSIVE METABOLIC PANEL
ALBUMIN: 3.3 g/dL — AB (ref 3.5–5.0)
ALK PHOS: 41 U/L (ref 38–126)
ALT: 16 U/L (ref 0–44)
AST: 17 U/L (ref 15–41)
Anion gap: 10 (ref 5–15)
BUN: 8 mg/dL (ref 6–20)
CO2: 21 mmol/L — ABNORMAL LOW (ref 22–32)
Calcium: 8.3 mg/dL — ABNORMAL LOW (ref 8.9–10.3)
Chloride: 105 mmol/L (ref 98–111)
Creatinine, Ser: 0.68 mg/dL (ref 0.44–1.00)
GFR calc Af Amer: >60 mL/min (ref >60)
GFR calc non Af Amer: >60 mL/min (ref >60)
GLUCOSE: 206 mg/dL — AB (ref 70–99)
Potassium: 3.6 mmol/L (ref 3.5–5.1)
Sodium: 136 mmol/L (ref 135–145)
Total Bilirubin: 0.5 mg/dL (ref 0.3–1.2)
Total Protein: 6.7 g/dL (ref 6.5–8.1)

## 2018-07-28 LAB — GROUP A STREP BY PCR: Group A Strep by PCR: NOT DETECTED

## 2018-07-28 LAB — CBC WITH DIFFERENTIAL/PLATELET
Abs Immature Granulocytes: 0.04 10*3/uL (ref 0.00–0.07)
BASOS ABS: 0 10*3/uL (ref 0.0–0.1)
Basophils Relative: 0 %
Eosinophils Absolute: 0 10*3/uL (ref 0.0–0.5)
Eosinophils Relative: 0 %
HEMATOCRIT: 40.2 % (ref 36.0–46.0)
Hemoglobin: 13.1 g/dL (ref 12.0–15.0)
Immature Granulocytes: 1 %
LYMPHS ABS: 1 10*3/uL (ref 0.7–4.0)
Lymphocytes Relative: 12 %
MCH: 27.7 pg (ref 26.0–34.0)
MCHC: 32.6 g/dL (ref 30.0–36.0)
MCV: 85 fL (ref 80.0–100.0)
Monocytes Absolute: 0.7 10*3/uL (ref 0.1–1.0)
Monocytes Relative: 8 %
NRBC: 0 % (ref 0.0–0.2)
Neutro Abs: 6.6 10*3/uL (ref 1.7–7.7)
Neutrophils Relative %: 79 %
Platelets: 119 10*3/uL — ABNORMAL LOW (ref 150–400)
RBC: 4.73 MIL/uL (ref 3.87–5.11)
RDW: 13.2 % (ref 11.5–15.5)
WBC: 8.3 10*3/uL (ref 4.0–10.5)

## 2018-07-28 LAB — INFLUENZA PANEL BY PCR (TYPE A & B)
INFLBPCR: NEGATIVE
Influenza A By PCR: POSITIVE — AB

## 2018-07-28 MED ORDER — OSELTAMIVIR PHOSPHATE 75 MG PO CAPS: 75.0000 mg | ORAL_CAPSULE | Freq: Two times a day (BID) | ORAL | 0 refills | Status: DC

## 2018-07-28 MED ORDER — IPRATROPIUM-ALBUTEROL 0.5-2.5 (3) MG/3ML IN SOLN
3.0000 mL | Freq: Once | RESPIRATORY_TRACT | Status: AC
  Administered 2018-07-28: 3 mL via RESPIRATORY_TRACT
  Filled 2018-07-28: qty 3

## 2018-07-28 MED ORDER — SODIUM CHLORIDE 0.9 % IV BOLUS
1000.0000 mL | Freq: Once | INTRAVENOUS | Status: AC
  Administered 2018-07-28: 1000 mL via INTRAVENOUS

## 2018-07-28 MED ORDER — OSELTAMIVIR PHOSPHATE 75 MG PO CAPS
75.0000 mg | ORAL_CAPSULE | Freq: Once | ORAL | Status: AC
  Administered 2018-07-28: 75 mg via ORAL
  Filled 2018-07-28: qty 1

## 2018-07-28 MED ORDER — PREDNISONE 50 MG PO TABS: ORAL_TABLET | ORAL | 0 refills | Status: DC

## 2018-07-28 MED ORDER — PREDNISONE 20 MG PO TABS
60.0000 mg | ORAL_TABLET | Freq: Once | ORAL | Status: AC
  Administered 2018-07-28: 60 mg via ORAL
  Filled 2018-07-28: qty 3

## 2018-07-28 MED ORDER — ALBUTEROL SULFATE HFA 108 (90 BASE) MCG/ACT IN AERS: 1.0000 | INHALATION_SPRAY | Freq: Four times a day (QID) | RESPIRATORY_TRACT | 0 refills | Status: DC | PRN

## 2018-07-28 NOTE — ED Notes (Signed)
Pt ambulated without assistance, sating between 97-98% on RA. No distress noted

## 2018-07-28 NOTE — ED Notes (Signed)
Pt discharged from ED; instructions provided and scripts given; Pt encouraged to return to ED if symptoms worsen and to f/u with PCP; Pt verbalized understanding of all instructions 

## 2018-07-28 NOTE — Discharge Instructions (Addendum)
Keep yourself hydrated.  Use Tylenol or ibuprofen as needed for aches and fevers.  Use your inhaler as needed for difficulty breathing and take the steroids as prescribed.  Follow-up with your doctor.  Return to the ED if you develop new or worsening symptoms.

## 2018-07-28 NOTE — ED Notes (Signed)
ED Provider at bedside. 

## 2018-07-28 NOTE — ED Provider Notes (Addendum)
[facility] EMERGENCY DEPARTMENT Provider Note   CSN: [id number] Arrival date & time: 07/27/18  2210     History   Chief Complaint Chief Complaint  Patient presents with  . flu symptoms    HPI Laurie Hess is a 38 y.o. female.  Patient sent from urgent care with a 3-day history of cough, sore throat, body aches, headache and chills and diarrhea.  Son was diagnosed with flu today.  Urgent care states "hypotension and rule out sepsis".  Patient states she has been feeling poorly for the past 3 or 4 days but not has checked her temperature at home.  She has had itchy and sore throat with nonproductive cough, generalized body aches and headache.  Several episodes of diarrhea today without vomiting.  No abdominal pain.  She been taking Tylenol and ibuprofen at home as well as Toradol urgent care.  Denies any other medical problems.  Has not been outside the country for the past 10 years.Chest hurts with coughing. No documented hypotension in urgent care.  Blood pressure is 115/80.  She had a negative strep screen and a negative flu test at urgent care.  The history is provided by the patient and a relative.    Past Medical History:  Diagnosis Date  . Asthma   . Gestational diabetes   . Seasonal allergies     Patient Active Problem List   Diagnosis Date Noted  . Flu-like symptoms 08/10/2013  . LGA (large for gestational age) fetus affecting mother, antepartum 08/03/2013  . GBS (group B streptococcus) UTI complicating pregnancy 05/20/2013  . Thrombocytopenia complicating pregnancy (HCC) 04/29/2013  . Bilateral Pyelectasis of fetus on prenatal ultrasound 04/10/2013  . Diabetes mellitus, Class A2/B, antepartum 03/22/2013  . Obesity complicating pregnancy, childbirth, or the puerperium, antepartum condition or complication(649.13) 02/19/2013    Past Surgical History:  Procedure Laterality Date  . CESAREAN SECTION N/A 08/14/2013   Procedure: CESAREAN SECTION;   Surgeon: Lesly Dukes, MD;  Location: [facility];  Service: Obstetrics;  Laterality: N/A;  . NO PAST SURGERIES       OB History    Gravida  3   Para  3   Term  3   Preterm      AB      Living  3     SAB      TAB      Ectopic      Multiple      Live Births  2            Home Medications    Prior to Admission medications   Medication Sig Start Date End Date Taking? Authorizing Provider  acetaminophen (TYLENOL) 500 MG tablet Take 1 tablet (500 mg total) by mouth every 6 (six) hours as needed. Patient not taking: Reported on 07/28/2018 08/10/13   Fayrene Helper, PA-C  albuterol (PROVENTIL HFA;VENTOLIN HFA) 108 (90 BASE) MCG/ACT inhaler Inhale 1-2 puffs into the lungs every 6 (six) hours as needed for wheezing. Patient not taking: Reported on 06/29/2018 04/14/13   Minta Balsam, MD  hydrocortisone (ANUSOL-HC) 2.5 % rectal cream Place 1 application rectally 2 (two) times daily. Patient not taking: Reported on 06/29/2018 09/19/13   Poe, Deirdre C, CNM  ibuprofen (ADVIL,MOTRIN) 600 MG tablet Take 1 tablet (600 mg total) by mouth every 6 (six) hours as needed for mild pain, moderate pain or cramping. Patient not taking: Reported on 07/28/2018 08/17/13   Cheral Marker, CNM    Family History  No family history on file.  Social History Social History   Tobacco Use  . Smoking status: Never Smoker  . Smokeless tobacco: Never Used  Substance Use Topics  . Alcohol use: No  . Drug use: No     Allergies   Dust mite extract   Review of Systems Review of Systems  Constitutional: Positive for activity change, appetite change, chills, fatigue and fever.  HENT: Positive for congestion, rhinorrhea and sore throat.   Respiratory: Positive for cough.   Cardiovascular: Negative for chest pain.  Gastrointestinal: Negative for abdominal pain.  Genitourinary: Negative for dysuria and hematuria.  Musculoskeletal: Positive for arthralgias and myalgias.  Skin: Negative for  rash.  Neurological: Positive for headaches. Negative for dizziness and weakness.    all other systems are negative except as noted in the HPI and PMH.    Physical Exam Updated Vital Signs BP 107/72 (BP Location: Left Arm)   Pulse 86   Temp 97.6 F (36.4 C) (Oral)   Resp 20   LMP 06/29/2018 (Exact Date)   SpO2 94%   Physical Exam Vitals signs and nursing note reviewed.  Constitutional:      General: She is not in acute distress.    Appearance: Normal appearance. She is well-developed. She is ill-appearing.     Comments: Ill-appearing but nontoxic  HENT:     Head: Normocephalic and atraumatic.     Right Ear: Tympanic membrane normal.     Left Ear: Tympanic membrane normal.     Nose: Congestion and rhinorrhea present.     Comments: Brown-yellow nasal discharge    Mouth/Throat:     Mouth: Mucous membranes are dry.     Pharynx: No oropharyngeal exudate.  Eyes:     Conjunctiva/sclera: Conjunctivae normal.     Pupils: Pupils are equal, round, and reactive to light.  Neck:     Musculoskeletal: Normal range of motion and neck supple.     Comments: No meningismus. Cardiovascular:     Rate and Rhythm: Normal rate and regular rhythm.     Heart sounds: Normal heart sounds. No murmur.  Pulmonary:     Effort: Pulmonary effort is normal. No respiratory distress.     Breath sounds: Wheezing present.     Comments: Diminished breath sounds bilaterally with scattered expiratory wheezing Abdominal:     Palpations: Abdomen is soft.     Tenderness: There is no abdominal tenderness. There is no guarding or rebound.  Musculoskeletal: Normal range of motion.        General: No tenderness.  Skin:    General: Skin is warm.     Capillary Refill: Capillary refill takes less than 2 seconds.  Neurological:     Mental Status: She is alert and oriented to person, place, and time.     Cranial Nerves: No cranial nerve deficit.     Motor: No abnormal muscle tone.     Coordination: Coordination  normal.     Comments: No ataxia on finger to nose bilaterally. No pronator drift. 5/5 strength throughout. CN 2-12 intact.Equal grip strength. Sensation intact.   Psychiatric:        Behavior: Behavior normal.      ED Treatments / Results  Labs (all labs ordered are listed, but only abnormal results are displayed) Labs Reviewed  INFLUENZA PANEL BY PCR (TYPE A & B) - Abnormal; Notable for the following components:      Result Value   Influenza A By PCR POSITIVE (*)  All other components within normal limits  CBC WITH DIFFERENTIAL/PLATELET - Abnormal; Notable for the following components:   Platelets 119 (*)    All other components within normal limits  COMPREHENSIVE METABOLIC PANEL - Abnormal; Notable for the following components:   CO2 21 (*)    Glucose, Bld 206 (*)    Calcium 8.3 (*)    Albumin 3.3 (*)    All other components within normal limits  GROUP A STREP BY PCR    EKG EKG Interpretation  Date/Time:  Saturday July 28 2018 03:40:59 EST Ventricular Rate:  89 PR Interval:    QRS Duration: 79 QT Interval:  328 QTC Calculation: 399 R Axis:   76 Text Interpretation:  Sinus rhythm Borderline short PR interval No significant change was found Confirmed by Glynn Octaveancour, Eletha Culbertson [phone]) on 07/28/2018 7:28:04 AM   Radiology Dg Chest 2 View  Result Date: 07/28/2018 CLINICAL DATA:  Cough and fever. EXAM: CHEST - 2 VIEW COMPARISON:  Radiograph 11/21/2012 FINDINGS: The cardiomediastinal contours are normal. Central and right infrahilar bronchial thickening. Pulmonary vasculature is normal. No consolidation, pleural effusion, or pneumothorax. No acute osseous abnormalities are seen. IMPRESSION: Central and right infrahilar bronchial thickening. No pneumonia. Electronically Signed   By: Narda RutherfordMelanie  Sanford M.D.   On: 07/28/2018 03:07  3 third  Procedures Procedures (including critical care time)  Medications Ordered in ED Medications - No data to display   Initial Impression /  Assessment and Plan / ED Course  I have reviewed the triage vital signs and the nursing notes.  Pertinent labs & imaging results that were available during my care of the patient were reviewed by me and considered in my medical decision making (see chart for details).    3 to 4 days of influenza-like symptoms are sent from urgent care.  Reportedly had negative strep test and negative flu swab.  Urgent care reported "hypotension" but none is documented and blood pressure was normal here. Chest sore with palpation. EKG nsr.   Presentation consistent with influenza-like illness.  No meningismus.  No recent out of country travel.  Will check chest x-ray  Patient given IV fluids and symptom control.  She is also given nebulizer and steroids.  Chest x-ray is negative for infiltrate.  Consistent with influenza-like illness.  Will treat with Tamiflu.  She is also given steroids for her wheezing and increased work of breathing.  Discussed supportive care flu influenza with hydration at home and antipyretics.  At shift change, patient has not been given her breathing treatment or walked with a pulse ox  Patient improved after nebulizers and steroids.  Good air exchange without wheezing.  She is ambulatory without desaturation.  Discussed p.o. hydration, antipyretics, steroids and Tamiflu.  Her inhaler will be refilled. Her work of breathing and air exchange is improved after treatments in the ED.  Discussed PCP follow-up.  Return precautions given.  BP 104/62 (BP Location: Right Arm)   Pulse 96   Temp 99.8 F (37.7 C) (Oral)   Resp 16   LMP 06/29/2018 (Exact Date)   SpO2 98%   Final Clinical Impressions(s) / ED Diagnoses   Final diagnoses:  Influenza-like illness  Mild intermittent asthma with exacerbation  ,  ED Discharge Orders    None       Jaelin Devincentis, Jeannett SeniorStephen, MD 07/28/18 Theodis [id number]    Glynn Octaveancour, Kennadi Albany, MD 07/28/18 [phone]

## 2018-07-28 NOTE — ED Notes (Signed)
Pt states she is tolerating fluids well and that they help her throat. Pt was able to walk slowly, but successfully in the hallway. Pt had some initial weakness upon standing but states she feels totally fine with the exception of the pain in her throat and upper respiratory area.

## 2018-07-30 ENCOUNTER — Ambulatory Visit (INDEPENDENT_AMBULATORY_CARE_PROVIDER_SITE_OTHER): Payer: BLUE CROSS/BLUE SHIELD | Admitting: Obstetrics and Gynecology

## 2018-07-30 ENCOUNTER — Encounter: Payer: Self-pay | Admitting: Obstetrics and Gynecology

## 2018-07-30 VITALS — BP 102/69 | HR 76 | Wt 176.0 lb

## 2018-07-30 DIAGNOSIS — Z30431 Encounter for routine checking of intrauterine contraceptive device: Secondary | ICD-10-CM | POA: Diagnosis not present

## 2018-07-30 NOTE — Progress Notes (Signed)
    GYNECOLOGY OFFICE ENCOUNTER NOTE  History:  38 y.o. [id number] here today for today for IUD string check; Mirena  IUD was placed 12/27. No complaints about the IUD, no concerning side effects.  Patient is here with a mask and has active influenza.   The following portions of the patient's history were reviewed and updated as appropriate: allergies, current medications, past family history, past medical history, past social history, past surgical history and problem list. Last pap smear on 06/29/2018 was normal, negative HRHPV.  Review of Systems:  Pertinent items are noted in HPI.   Objective:  Physical Exam Blood pressure 102/69, pulse 76, weight 176 lb (79.8 kg), currently breastfeeding. CONSTITUTIONAL: Well-developed, well-nourished female in no acute distress.  HENT:  Normocephalic, atraumatic. External right and left ear normal. Oropharynx is clear and moist EYES: Conjunctivae and EOM are normal. Pupils are equal, round, and reactive to light. No scleral icterus.  NECK: Normal range of motion, supple, no masses CARDIOVASCULAR: Normal heart rate noted RESPIRATORY: Effort and breath sounds normal, no problems with respiration noted ABDOMEN: Soft, no distention noted.   PELVIC: Normal appearing external genitalia; normal appearing vaginal mucosa and cervix.  IUD strings visualized.  Assessment & Plan:  Patient to keep IUD in place for up to seven years; can come in for removal if she desires pregnancy earlier or for or concerning side effects.    Caulin Begley, Harolyn Rutherford, NP Faculty Practice Center for [employer], [facility]

## 2018-07-30 NOTE — Progress Notes (Signed)
Here for sting ck after iud placement. Reports doing well after iud insertion. Was dx with flu 2 days ago and on Tamiflu. Doing better from flu as well

## 2019-11-25 ENCOUNTER — Encounter: Payer: Self-pay | Admitting: Neurology

## 2019-11-25 ENCOUNTER — Ambulatory Visit: Payer: 59 | Admitting: Neurology

## 2019-11-25 ENCOUNTER — Other Ambulatory Visit: Payer: Self-pay

## 2019-11-25 VITALS — BP 112/70 | HR 72 | Ht 64.0 in | Wt 169.3 lb

## 2019-11-25 DIAGNOSIS — M79672 Pain in left foot: Secondary | ICD-10-CM

## 2019-11-25 DIAGNOSIS — R202 Paresthesia of skin: Secondary | ICD-10-CM | POA: Diagnosis not present

## 2019-11-25 DIAGNOSIS — R200 Anesthesia of skin: Secondary | ICD-10-CM

## 2019-11-25 NOTE — Patient Instructions (Addendum)
Your neurological exam is good today, no significant weakness or numbness, thankfully.  You have a mildly elevated A1c, please continue to work on better diabetes control.  You may be at risk for diabetic neuropathy, nerve damage.  As discussed, I will request blood test results from your recent blood test through Zoe Lan, nurse practitioner.  We will add some more blood work today, to see if there is any treatable causes of your symptoms, you may have arthritis affecting your hands and left foot as you describe foot pain with repetitive use of your left foot.  You may benefit from seeing a foot doctor.  Please talk to Zoe Lan about a referral.  We will also proceed with electrical nerve and muscle testing called EMG nerve conduction velocity test.  We will call you with the test results to keep you posted.

## 2019-11-25 NOTE — Progress Notes (Signed)
Subjective:    Patient ID: Laurie Hess is a 39 y.o. female.  HPI     Huston Foley, MD, PhD [facility] [address] [address] [place], [place] [address]  Dear Boyd Kerbs,   I saw your patient, Laurie Hess, upon your kind request, in my neurologic clinic today for initial consultation of her paresthesias affecting the left upper and lower extremities.  The patient is accompanied by her husband today.  As you know, Laurie Hess is a 39 year old right-handed woman with an underlying medical history of diabetes and overweight state, who reports tingling in the upper and lower extremities, in the hands and feet for the past 2-3 months and pain in the hands and L foot, particularly in the forefoot and toes on the L, not so much on the right. I reviewed your office note from 10/23/2019.  She had blood work at the time including BMP, A1c, TSH, and B12, we will request blood test results from your office.  Her A1c from December 2020 was 7.2. She has not fallen, thankfully, but at times feels weak all over. She had to take a rest, while at work. She works for [employer]  Her husband reports that it is hard for her to get out of bed in the mornings.  She reports a history of low back pain.  She does not have a recent history of flareup of back pain.  She has tried ibuprofen for her pain.  She had a recent increase in her Metformin.  Her Past Medical History Is Significant For: Past Medical History:  Diagnosis Date  . Asthma   . Gestational diabetes   . Seasonal allergies     Her Past Surgical History Is Significant For: Past Surgical History:  Procedure Laterality Date  . CESAREAN SECTION N/A 08/14/2013   Procedure: CESAREAN SECTION;  Surgeon: Lesly Dukes, MD;  Location: [facility];  Service: Obstetrics;  Laterality: N/A;  . NO PAST SURGERIES      Her Family History Is Significant For: No family history on file.  Her Social History Is Significant For: Social  History   Socioeconomic History  . Marital status: Married    Spouse name: Not on file  . Number of children: Not on file  . Years of education: Not on file  . Highest education level: Not on file  Occupational History  . Not on file  Tobacco Use  . Smoking status: Never Smoker  . Smokeless tobacco: Never Used  Substance and Sexual Activity  . Alcohol use: No  . Drug use: No  . Sexual activity: Yes    Birth control/protection: I.U.D.  Other Topics Concern  . Not on file  Social History Narrative  . Not on file   Social Determinants of Health   Financial Resource Strain:   . Difficulty of Paying Living Expenses:   Food Insecurity:   . Worried About Programme researcher, broadcasting/film/video in the Last Year:   . Barista in the Last Year:   Transportation Needs:   . Freight forwarder (Medical):   Marland Kitchen Lack of Transportation (Non-Medical):   Physical Activity:   . Days of Exercise per Week:   . Minutes of Exercise per Session:   Stress:   . Feeling of Stress :   Social Connections:   . Frequency of Communication with Friends and Family:   . Frequency of Social Gatherings with Friends and Family:   . Attends Religious Services:   .  Active Member of Clubs or Organizations:   . Attends Banker Meetings:   Marland Kitchen Marital Status:     Her Allergies Are:  Allergies  Allergen Reactions  . Dust Mite Extract Itching  :   Her Current Medications Are:  Outpatient Encounter Medications as of 11/25/2019  Medication Sig  . ibuprofen (ADVIL,MOTRIN) 600 MG tablet Take 1 tablet (600 mg total) by mouth every 6 (six) hours as needed for mild pain, moderate pain or cramping.  . metFORMIN (GLUCOPHAGE-XR) 500 MG 24 hr tablet Take by mouth.  . [DISCONTINUED] acetaminophen (TYLENOL) 500 MG tablet Take 1 tablet (500 mg total) by mouth every 6 (six) hours as needed. (Patient not taking: Reported on 07/28/2018)  . [DISCONTINUED] albuterol (PROVENTIL HFA;VENTOLIN HFA) 108 (90 Base) MCG/ACT  inhaler Inhale 1-2 puffs into the lungs every 6 (six) hours as needed for wheezing.  . [DISCONTINUED] hydrocortisone (ANUSOL-HC) 2.5 % rectal cream Place 1 application rectally 2 (two) times daily. (Patient not taking: Reported on 06/29/2018)  . [DISCONTINUED] oseltamivir (TAMIFLU) 75 MG capsule Take 1 capsule (75 mg total) by mouth every 12 (twelve) hours.  . [DISCONTINUED] predniSONE (DELTASONE) 50 MG tablet 1 tablet PO daily   No facility-administered encounter medications on file as of 11/25/2019.  :   Review of Systems:  Out of a complete 14 point review of systems, all are reviewed and negative with the exception of these symptoms as listed below:  Review of Systems  Neurological:       Here to discuss worsening weakness/numbness in her left leg/hand. Pt reports sx have been present for 2-3 months. Pt has tried ibuprofen in the past to help with sx.     Objective:  Neurological Exam  Physical Exam Physical Examination:   Vitals:   11/25/19 1425  BP: 112/70  Pulse: 72    General Examination: The patient is a very pleasant 39 y.o. female in no acute distress. She appears well-developed and well-nourished and well groomed.   HEENT: Normocephalic, atraumatic, pupils are equal, round and reactive to light and accommodation. Funduscopic exam is normal with sharp disc margins noted. Extraocular tracking is good without limitation to gaze excursion or nystagmus noted. Normal smooth pursuit is noted. Hearing is grossly intact. Face is symmetric with normal facial animation and normal facial sensation. Speech is clear with no dysarthria noted. There is no hypophonia. There is no lip, neck/head, jaw or voice tremor. Neck is supple with full range of passive and active motion. There are no carotid bruits on auscultation. Oropharynx exam reveals: mild mouth dryness, good dental hygiene. Tongue protrudes centrally and palate elevates symmetrically.   Chest: Clear to auscultation without  wheezing, rhonchi or crackles noted.  Heart: S1+S2+0, regular and normal without murmurs, rubs or gallops noted.   Abdomen: Soft, non-tender and non-distended with normal bowel sounds appreciated on auscultation.  Extremities: There is no pitting edema in the distal lower extremities bilaterally. Pedal pulses are intact.  Skin: Warm and dry without trophic changes noted.  Musculoskeletal: exam reveals pain in both hands and left foot.  She reports that she cannot put her feet in her shoes completely and has only the front of the feet in her sneakers.   Neurologically:  Mental status: The patient is awake, alert and oriented in all 4 spheres. Her immediate and remote memory, attention, language skills and fund of knowledge are appropriate. There is no evidence of aphasia, agnosia, apraxia or anomia. Speech is clear with normal prosody and enunciation. Thought  process is linear. Mood is normal and affect is normal.  Cranial nerves II - XII are as described above under HEENT exam. In addition: shoulder shrug is normal with equal shoulder height noted. Motor exam: Normal bulk, strength and tone is noted, but she does have some giveaway type weakness and variable effort with grip strength and intrinsic hand muscle strength testing. There is no drift, tremor or rebound. Romberg is negative. Reflexes are 2+ throughout. Babinski: Toes are flexor bilaterally. Fine motor skills and coordination: intact with normal finger taps, normal hand movements, normal rapid alternating patting, normal foot taps and normal foot agility.  Cerebellar testing: No dysmetria or intention tremor on finger to nose testing. Heel to shin is unremarkable bilaterally. There is no truncal or gait ataxia.  Sensory exam: intact to light touch, pinprick, vibration, temperature sense in the upper and lower extremities.  Gait, station and balance: She stands easily. No veering to one side is noted. No leaning to one side is noted.  Posture is age-appropriate and stance is narrow based. Gait shows normal stride length and normal pace. No problems turning are noted. Tandem walk is unremarkable.  Assessment and plan:  In summary, Laurie Hess is a very pleasant 39 y.o.-year old female with an underlying medical history of diabetes and overweight state, who presents for evaluation of her numbness and tingling affecting all 4 extremities in the hands and feet for the past 2 to 3 months.  She also reports pain in her hands and left foot, not so much her right foot.  Her examination is benign, she has some variable strength, some giveaway weakness upon strength testing, sensory exam is benign throughout, she reports left foot pain and may have a component of arthritis affecting her symptoms.  She had an elevated A1c in December of last year and the most recent blood test results are your office are pending for my review.  I suggested we proceed with additional testing to look for treatable causes of her symptoms, her diabetes certainly does put her at risk for neuropathy but her presentation also could be seen with arthritis.  She is encouraged to talk to you about her foot pain and the possibility of seeing a podiatrist.  She reports that she has pain particularly when she is on her feet at work and when she uses a vehicle she has to drive at work.  We will keep her posted as to her blood test result and also schedule her for EMG and nerve conduction velocity testing to see if there is any evidence of peripheral neuropathy.  I plan to see her in follow-up after testing and we will call her with the results in the interim as well.  She is advised to use ibuprofen as needed for her foot pain.  I answered all their questions today and the patient and her husband were in agreement.   Thank you very much for allowing me to participate in the care of this nice patient. If I can be of any further assistance to you please do not hesitate to call me at  [phone].  Sincerely,   Star Age, MD, PhD

## 2019-11-28 NOTE — Progress Notes (Signed)
Most labs are back and normal; please update pt. 2 tests are pending, namely vit B1 and B6; we will call if abn. sa

## 2019-11-30 LAB — MULTIPLE MYELOMA PANEL, SERUM
Albumin SerPl Elph-Mcnc: 3.4 g/dL (ref 2.9–4.4)
Albumin/Glob SerPl: 1.1 (ref 0.7–1.7)
Alpha 1: 0.2 g/dL (ref 0.0–0.4)
Alpha2 Glob SerPl Elph-Mcnc: 0.7 g/dL (ref 0.4–1.0)
B-Globulin SerPl Elph-Mcnc: 1.1 g/dL (ref 0.7–1.3)
Gamma Glob SerPl Elph-Mcnc: 1.1 g/dL (ref 0.4–1.8)
Globulin, Total: 3.1 g/dL (ref 2.2–3.9)
IgA/Immunoglobulin A, Serum: 221 mg/dL (ref 87–352)
IgG (Immunoglobin G), Serum: 1085 mg/dL (ref 586–1602)
IgM (Immunoglobulin M), Srm: 149 mg/dL (ref 26–217)
Total Protein: 6.5 g/dL (ref 6.0–8.5)

## 2019-11-30 LAB — VITAMIN B1: Thiamine: 87.9 nmol/L (ref 66.5–200.0)

## 2019-11-30 LAB — CK: Total CK: 83 U/L (ref 32–182)

## 2019-11-30 LAB — VITAMIN B6: Vitamin B6: 9.2 ug/L (ref 2.0–32.8)

## 2019-11-30 LAB — RHEUMATOID FACTOR: Rheumatoid fact SerPl-aCnc: <10 IU/mL (ref 0.0–13.9)

## 2019-11-30 LAB — C-REACTIVE PROTEIN: CRP: 3 mg/L (ref 0–10)

## 2019-11-30 LAB — ANA W/REFLEX: Anti Nuclear Antibody (ANA): NEGATIVE

## 2019-12-03 ENCOUNTER — Telehealth: Payer: Self-pay

## 2019-12-03 NOTE — Telephone Encounter (Signed)
-----   Message from Huston Foley, MD sent at 11/28/2019  4:47 PM EDT ----- Most labs are back and normal; please update pt. 2 tests are pending, namely vit B1 and B6; we will call if abn. Janene Harvey

## 2019-12-03 NOTE — Telephone Encounter (Signed)
I called pt. No answer, left a message asking pt to call me back.   

## 2019-12-05 NOTE — Telephone Encounter (Signed)
Pt husband called back in regards to missed call. Informed husband we need wife to call back due to no DPR on file pt husband became extremely rude and insisted he can have information because he is the husband advised a message would be put to RN

## 2019-12-05 NOTE — Telephone Encounter (Signed)
I contacted the pt's husband ( he was present during the last appt and dpr was just not scanned into epic) and advised of results. He verbalized understanding.

## 2019-12-18 ENCOUNTER — Ambulatory Visit (INDEPENDENT_AMBULATORY_CARE_PROVIDER_SITE_OTHER): Payer: No Typology Code available for payment source | Admitting: Neurology

## 2019-12-18 ENCOUNTER — Encounter (INDEPENDENT_AMBULATORY_CARE_PROVIDER_SITE_OTHER): Payer: No Typology Code available for payment source | Admitting: Neurology

## 2019-12-18 DIAGNOSIS — R202 Paresthesia of skin: Secondary | ICD-10-CM | POA: Diagnosis not present

## 2019-12-18 DIAGNOSIS — Z0289 Encounter for other administrative examinations: Secondary | ICD-10-CM

## 2019-12-18 DIAGNOSIS — R200 Anesthesia of skin: Secondary | ICD-10-CM

## 2019-12-18 DIAGNOSIS — M79672 Pain in left foot: Secondary | ICD-10-CM

## 2019-12-18 NOTE — Procedures (Signed)
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  Left peroneal to EDB, tibial, median, and ulnar motor responses were normal  Electromyography: Selected needle examination of left upper, lower extremity muscles, left cervical, and lumbar sacral paraspinal muscles were normal.  Conclusion: This is a normal study, there is no electrodiagnostic evidence of large fiber peripheral neuropathy, left lower extremity neuropathy, left lumbar sacral radiculopathy.    ------------------------------- Levert Feinstein, M.D. PhD  [facility] [address] [place], [place] [address] Tel: [phone] Fax: [phone]  Verbal informed consent was obtained from the patient, patient was informed of potential risk of procedure, including bruising, bleeding, hematoma formation, infection, muscle weakness, muscle pain, numbness, among others.         MNC    Nerve / Sites Muscle Latency Ref. Amplitude Ref. Rel Amp Segments Distance Velocity Ref. Area    ms ms mV mV %  cm m/s m/s mVms  L Median - APB     Wrist APB 2.8 ?4.4 8.9 ?4.0 100 Wrist - APB 7   31.1     Upper arm APB 6.4  9.7  109 Upper arm - Wrist 20 55 ?49 36.6  L Ulnar - ADM     Wrist ADM 2.2 ?3.3 10.1 ?6.0 100 Wrist - ADM 7   30.7     B.Elbow ADM 4.6  9.4  93 B.Elbow - Wrist 17 69 ?49 30.8     A.Elbow ADM 6.2  9.8  104 A.Elbow - B.Elbow 10 64 ?49 30.4         A.Elbow - Wrist      L Peroneal - EDB     Ankle EDB 4.0 ?6.5 6.9 ?2.0  100 Ankle - EDB 9   19.3     Fib head EDB 9.0  6.1  87.8 Fib head - Ankle 26 52 ?44 20.2     Pop fossa EDB 10.9  5.9  97.4 Pop fossa - Fib head 10 55 ?44 18.0         Pop fossa - Ankle      L Tibial - AH     Ankle AH 3.4 ?5.8 11.3 ?4.0 100 Ankle - AH 9   20.6     Pop fossa AH 11.6  10.7  94.5 Pop fossa - Ankle 34 42 ?41 20.2             SNC    Nerve / Sites Rec. Site Peak Lat Ref.  Amp Ref. Segments Distance Peak Diff Ref.    ms ms V V  cm ms ms  L Sural - Ankle (Calf)     Calf Ankle 3.4 ?4.4 14 ?6 Calf - Ankle 14    L Superficial peroneal - Ankle     Lat leg Ankle 4.0 ?4.4 9 ?6 Lat leg - Ankle 14    L Median, Ulnar - Transcarpal comparison     Median Palm Wrist 1.7 ?2.2 105 ?35 Median Palm - Wrist 8       Ulnar Palm Wrist  1.5 ?2.2 26 ?12 Ulnar Palm - Wrist 8          Median Palm - Ulnar Palm  0.2 ?0.4  L Median - Orthodromic (Dig II, Mid palm)     Dig II Wrist 2.6 ?3.4 17 ?10 Dig II - Wrist 13    L Ulnar - Orthodromic, (Dig V, Mid palm)     Dig V Wrist 2.3 ?3.1 12 ?5 Dig V - Wrist 33                 F  Wave    Nerve F Lat Ref.   ms ms  L Tibial - AH 45.4 ?56.0  L Ulnar - ADM 23.9 ?32.0         EMG Summary Table    Spontaneous MUAP Recruitment  Muscle IA Fib PSW Fasc Other Amp Dur. Poly Pattern  L. First dorsal interosseous Normal None None None _______ Normal Normal Normal Normal  L. Pronator teres Normal None None None _______ Normal Normal Normal Normal  L. Biceps brachii Normal None None None _______ Normal Normal Normal Normal  L. Deltoid Normal None None None _______ Normal Normal Normal Normal  L. Triceps brachii Normal None None None _______ Normal Normal Normal Normal  L. Cervical paraspinals Normal None None None _______ Normal Normal Normal Normal  L. Tibialis anterior Normal None None None _______ Normal Normal Normal Normal  L. Tibialis posterior Normal None None None _______ Normal Normal Normal Normal  L. Peroneus longus Normal None None None _______ Normal  Normal Normal Normal  L. Gastrocnemius (Medial head) Normal None None None _______ Normal Normal Normal Normal  L. Vastus lateralis Normal None None None _______ Normal Normal Normal Normal  L. Lumbar paraspinals (mid) Normal None None None _______ Normal Normal Normal Normal  L. Lumbar paraspinals (low) Normal None None None _______ Normal Normal Normal Normal

## 2019-12-19 NOTE — Progress Notes (Signed)
Please call and advise the patient that the recent EMG and nerve conduction velocity test, which is the electrical nerve and muscle test we we performed, was reported as within normal limits. We checked for abnormal electrical discharges in the muscles or nerves and the report suggested normal findings. No further action is required on this test at this time. At this point, I would recommend, she consider seeing a podiatrist for foot pain. She can also talk to her PCP about seeing an orthopedic specialist.  Huston Foley, MD, PhD

## 2019-12-24 ENCOUNTER — Telehealth: Payer: Self-pay | Admitting: *Deleted

## 2019-12-24 NOTE — Telephone Encounter (Signed)
-----   Message from Huston Foley, MD sent at 12/19/2019 10:23 AM EDT ----- Please call and advise the patient that the recent EMG and nerve conduction velocity test, which is the electrical nerve and muscle test we we performed, was reported as within normal limits. We checked for abnormal electrical discharges in the muscles or nerves and the report suggested normal findings. No further action is required on this test at this time. At this point, I would recommend, she consider seeing a podiatrist for foot pain. She can also talk to her PCP about seeing an orthopedic specialist.  Huston Foley, MD, PhD

## 2019-12-24 NOTE — Telephone Encounter (Signed)
I called the pt and the husband Gerlean Ren (on [place]) answered. Pt not available. I was told to give him the message. I discussed the EMG/NCV results from Dr. Frances Furbish with him including the recommendations. He verbalized understanding and his questions were answered. I encouraged him to have the pt call us if she has any questions when he tells her the results. He verbalized appreciation for the call. Next appt with Dr. Frances Furbish is 02/25/20 at 1:00 pm.

## 2020-02-25 ENCOUNTER — Ambulatory Visit: Payer: 59 | Admitting: Neurology

## 2020-06-12 ENCOUNTER — Encounter: Payer: Self-pay | Admitting: Podiatry

## 2020-06-12 ENCOUNTER — Other Ambulatory Visit: Payer: Self-pay

## 2020-06-12 ENCOUNTER — Ambulatory Visit: Payer: No Typology Code available for payment source | Admitting: Podiatry

## 2020-06-12 ENCOUNTER — Ambulatory Visit (INDEPENDENT_AMBULATORY_CARE_PROVIDER_SITE_OTHER): Payer: No Typology Code available for payment source

## 2020-06-12 DIAGNOSIS — G5762 Lesion of plantar nerve, left lower limb: Secondary | ICD-10-CM

## 2020-06-12 DIAGNOSIS — M21962 Unspecified acquired deformity of left lower leg: Secondary | ICD-10-CM

## 2020-06-12 DIAGNOSIS — Q666 Other congenital valgus deformities of feet: Secondary | ICD-10-CM

## 2020-06-12 DIAGNOSIS — M79672 Pain in left foot: Secondary | ICD-10-CM

## 2020-06-12 NOTE — Patient Instructions (Signed)

## 2020-06-16 ENCOUNTER — Encounter: Payer: Self-pay | Admitting: Podiatry

## 2020-06-16 NOTE — Progress Notes (Signed)
Subjective:  Patient ID: Laurie Hess, female    DOB: 1981/01/13,  MRN: [id number]  Chief Complaint  Patient presents with  . Foot Pain    Left foot pain for about 4--5 months. Under the toes PT stated that she has a burning sensation.     39 y.o. female presents with the above complaint.  Patient presents with complaint of left fourth interspace neuroma.  Patient states that her for last 4 to 5 months is sharp shooting with burning sensation.  It hurts after walking for 30 minutes.  There is burning sensation to it.  Patient is currently on her foot on [employer] but still toe boots.  She has not seen anyone else prior to see me.  She denies any other acute complaints.  She would like to discuss treatment options.  She is also experienced generalized pain in the arch and the heel likely due to underlying flatfoot.  She denies any other acute complaints   Review of Systems: Negative except as noted in the HPI. Denies N/V/F/Ch.  Past Medical History:  Diagnosis Date  . Asthma   . Gestational diabetes   . Seasonal allergies     Current Outpatient Medications:  .  albuterol (VENTOLIN HFA) 108 (90 Base) MCG/ACT inhaler, Inhale into the lungs., Disp: , Rfl:  .  gabapentin (NEURONTIN) 100 MG capsule, Take 100 mg by mouth 3 (three) times daily., Disp: , Rfl:  .  ibuprofen (ADVIL,MOTRIN) 600 MG tablet, Take 1 tablet (600 mg total) by mouth every 6 (six) hours as needed for mild pain, moderate pain or cramping., Disp: 30 tablet, Rfl: 0 .  Lancets (ONETOUCH DELICA PLUS LANCET33G) MISC, Apply 1 each topically 3 (three) times daily., Disp: , Rfl:  .  metFORMIN (GLUCOPHAGE-XR) 500 MG 24 hr tablet, Take by mouth., Disp: , Rfl:  .  ONETOUCH ULTRA test strip, 1 each daily., Disp: , Rfl:   Social History   Tobacco Use  Smoking Status Never Smoker  Smokeless Tobacco Never Used    Allergies  Allergen Reactions  . Dust Mite Extract Itching   Objective:  There were no vitals filed for this  visit. There is no height or weight on file to calculate BMI. Constitutional Well developed. Well nourished.  Vascular Dorsalis pedis pulses palpable bilaterally. Posterior tibial pulses palpable bilaterally. Capillary refill normal to all digits.  No cyanosis or clubbing noted. Pedal hair growth normal.  Neurologic Normal speech. Oriented to person, place, and time. Epicritic sensation to light touch grossly present bilaterally.  Dermatologic Nails well groomed and normal in appearance. No open wounds. No skin lesions.  Orthopedic:  Pain on palpation to the left fourth interspace positive Mulder click noted no pain with range of motion of fourth and fifth digit.  Pain with lateral squeeze test.  Generalized pain in the arch and the heel.  Gait examination shows semiflexible pes planovalgus with calcaneal eversion partially able to recruit the arch with dorsiflexion of the hallux.  Mild to many toe signs   Radiographs: 3 views of skeletally mature left foot: No bony abnormalities noted.  No breaks noted.  Mild arthritic changes noted to the dorsal midfoot.  No other bony abnormalities identified Assessment:   1. Pes planovalgus   2. Morton's neuroma of left foot   3. Deformity, foot acquired, left    Plan:  Patient was evaluated and treated and all questions answered.  Left fourth interspace neuroma -I explained to the patient the etiology of neuroma and various treatment  options were discussed.  At this time patient would like to hold off on any kind of injections or surgical treatment plans.  I discussed with him the importance of orthotics and padding.  I believe patient will benefit from custom-made orthotics with incorporation of metatarsal pads  Pes planovalgus 0-I explained to the patient the etiology of pes planovalgus and various treatment options were discussed I believe this will help with the neuroma as well as generalized pain in the arch of the heel.  Patient agrees with  plan like to get orthotics. -Should be scheduled see rec for custom-made orthotics  Return in about 4 months (around 10/11/2020).

## 2020-06-25 ENCOUNTER — Other Ambulatory Visit: Payer: No Typology Code available for payment source | Admitting: Orthotics

## 2020-07-15 ENCOUNTER — Ambulatory Visit: Payer: No Typology Code available for payment source | Admitting: Podiatry

## 2021-05-13 ENCOUNTER — Emergency Department (HOSPITAL_COMMUNITY)
Admission: EM | Admit: 2021-05-13 | Discharge: 2021-05-14 | Disposition: A | Discharge status: Home / Self Care | Payer: Medicaid Other | Attending: Emergency Medicine | Admitting: Emergency Medicine

## 2021-05-13 ENCOUNTER — Emergency Department (HOSPITAL_COMMUNITY): Payer: Medicaid Other

## 2021-05-13 DIAGNOSIS — Z5321 Procedure and treatment not carried out due to patient leaving prior to being seen by health care provider: Secondary | ICD-10-CM | POA: Diagnosis not present

## 2021-05-13 DIAGNOSIS — R0602 Shortness of breath: Secondary | ICD-10-CM | POA: Insufficient documentation

## 2021-05-13 DIAGNOSIS — R0789 Other chest pain: Secondary | ICD-10-CM | POA: Diagnosis not present

## 2021-05-13 DIAGNOSIS — Z20822 Contact with and (suspected) exposure to covid-19: Secondary | ICD-10-CM | POA: Insufficient documentation

## 2021-05-13 DIAGNOSIS — R062 Wheezing: Secondary | ICD-10-CM | POA: Diagnosis not present

## 2021-05-13 DIAGNOSIS — J45901 Unspecified asthma with (acute) exacerbation: Secondary | ICD-10-CM | POA: Insufficient documentation

## 2021-05-13 DIAGNOSIS — R0600 Dyspnea, unspecified: Secondary | ICD-10-CM

## 2021-05-13 LAB — CBC WITH DIFFERENTIAL/PLATELET
Abs Immature Granulocytes: 0.02 10*3/uL (ref 0.00–0.07)
Basophils Absolute: 0.1 10*3/uL (ref 0.0–0.1)
Basophils Relative: 1 %
Eosinophils Absolute: 0.7 10*3/uL — ABNORMAL HIGH (ref 0.0–0.5)
Eosinophils Relative: 9 %
HCT: 38.8 % (ref 36.0–46.0)
Hemoglobin: 13.2 g/dL (ref 12.0–15.0)
Immature Granulocytes: 0 %
Lymphocytes Relative: 36 %
Lymphs Abs: 2.9 10*3/uL (ref 0.7–4.0)
MCH: 28.8 pg (ref 26.0–34.0)
MCHC: 34 g/dL (ref 30.0–36.0)
MCV: 84.7 fL (ref 80.0–100.0)
Monocytes Absolute: 0.4 10*3/uL (ref 0.1–1.0)
Monocytes Relative: 5 %
Neutro Abs: 3.8 10*3/uL (ref 1.7–7.7)
Neutrophils Relative %: 49 %
Platelets: 179 10*3/uL (ref 150–400)
RBC: 4.58 MIL/uL (ref 3.87–5.11)
RDW: 13.1 % (ref 11.5–15.5)
WBC: 7.8 10*3/uL (ref 4.0–10.5)
nRBC: 0 % (ref 0.0–0.2)

## 2021-05-13 MED ORDER — ALBUTEROL SULFATE (2.5 MG/3ML) 0.083% IN NEBU
5.0000 mg | INHALATION_SOLUTION | Freq: Once | RESPIRATORY_TRACT | Status: AC
  Administered 2021-05-13: 5 mg via RESPIRATORY_TRACT
  Filled 2021-05-13: qty 6

## 2021-05-13 NOTE — ED Notes (Signed)
Pt reports feeling better after breathing treatment.

## 2021-05-13 NOTE — ED Triage Notes (Signed)
Pt here from home for asthma excarebation, pt c/o shob that started 2-3 hours ago, albuterol inhaler did not help, no home nebulizer. Pt has audible wheezing, chest tightness. Given albuterol neb in triage.

## 2021-05-13 NOTE — ED Provider Notes (Signed)
Emergency Medicine Provider Triage Evaluation Note  Laurie Hess , a 40 y.o. female  was evaluated in triage.  Pt complains of SOB over the past 2-3 days.  Reports some cough but denies fever or sick contacts.  Hx of asthma and has used inhaler without relief.  Review of Systems  Positive: SOB, wheezing Negative: fever  Physical Exam  BP 108/74   Pulse 86   Temp 97.9 F (36.6 C) (Oral)   Resp (!) 25   SpO2 99%   Gen:   Awake, no distress   Resp:  Increased WOB, tachypneic, wheezing audible from bedside, O2 99% MSK:   Moves extremities without difficulty  Other:    Medical Decision Making  Medically screening exam initiated at 10:32 PM.  Appropriate orders placed.  Laurie Hess was informed that the remainder of the evaluation will be completed by another provider, this initial triage assessment does not replace that evaluation, and the importance of remaining in the ED until their evaluation is complete.  SOB for the past 2-3 days.  Hx of asthma.  Wheezing audible from bedside.  Given neb in triage.  EKG, labs, CXR ordered.   Laurie Hatchet, PA-C 05/13/21 2234    Alvira Monday, MD 05/14/21 [phone]

## 2021-05-14 LAB — BASIC METABOLIC PANEL
Anion gap: 7 (ref 5–15)
BUN: 9 mg/dL (ref 6–20)
CO2: 26 mmol/L (ref 22–32)
Calcium: 9 mg/dL (ref 8.9–10.3)
Chloride: 101 mmol/L (ref 98–111)
Creatinine, Ser: 0.85 mg/dL (ref 0.44–1.00)
GFR, Estimated: >60 mL/min (ref >60)
Glucose, Bld: 187 mg/dL — ABNORMAL HIGH (ref 70–99)
Potassium: 3.5 mmol/L (ref 3.5–5.1)
Sodium: 134 mmol/L — ABNORMAL LOW (ref 135–145)

## 2021-05-14 LAB — RESP PANEL BY RT-PCR (FLU A&B, COVID) ARPGX2
Influenza A by PCR: NEGATIVE
Influenza B by PCR: NEGATIVE
SARS Coronavirus 2 by RT PCR: NEGATIVE

## 2021-05-14 LAB — TROPONIN I (HIGH SENSITIVITY)
Troponin I (High Sensitivity): 2 ng/L (ref <18)
Troponin I (High Sensitivity): 4 ng/L (ref <18)

## 2021-05-14 NOTE — ED Notes (Signed)
Pt is frustrated and is leaving.

## 2022-07-26 ENCOUNTER — Encounter: Payer: Medicaid Other | Admitting: Internal Medicine
